# Patient Record
Sex: Female | Born: 1950 | Race: White | Hispanic: No | Smoking: Former smoker
Health system: Southern US, Community
[De-identification: ages and names within clinical notes are randomized; demographics above are authoritative.]

## PROBLEM LIST (undated history)

## (undated) DIAGNOSIS — Z973 Presence of spectacles and contact lenses: Secondary | ICD-10-CM

## (undated) DIAGNOSIS — J189 Pneumonia, unspecified organism: Secondary | ICD-10-CM

## (undated) DIAGNOSIS — I10 Essential (primary) hypertension: Secondary | ICD-10-CM

## (undated) DIAGNOSIS — C50919 Malignant neoplasm of unspecified site of unspecified female breast: Secondary | ICD-10-CM

## (undated) DIAGNOSIS — R112 Nausea with vomiting, unspecified: Secondary | ICD-10-CM

## (undated) DIAGNOSIS — E785 Hyperlipidemia, unspecified: Secondary | ICD-10-CM

## (undated) DIAGNOSIS — Z9889 Other specified postprocedural states: Secondary | ICD-10-CM

## (undated) DIAGNOSIS — M1990 Unspecified osteoarthritis, unspecified site: Secondary | ICD-10-CM

## (undated) DIAGNOSIS — T753XXA Motion sickness, initial encounter: Secondary | ICD-10-CM

## (undated) DIAGNOSIS — I739 Peripheral vascular disease, unspecified: Secondary | ICD-10-CM

## (undated) DIAGNOSIS — E240 Pituitary-dependent Cushing's disease: Secondary | ICD-10-CM

## (undated) DIAGNOSIS — R519 Headache, unspecified: Secondary | ICD-10-CM

## (undated) DIAGNOSIS — T7840XA Allergy, unspecified, initial encounter: Secondary | ICD-10-CM

## (undated) DIAGNOSIS — F419 Anxiety disorder, unspecified: Secondary | ICD-10-CM

## (undated) DIAGNOSIS — F32A Depression, unspecified: Secondary | ICD-10-CM

## (undated) DIAGNOSIS — G4730 Sleep apnea, unspecified: Secondary | ICD-10-CM

## (undated) DIAGNOSIS — Z87440 Personal history of urinary (tract) infections: Secondary | ICD-10-CM

## (undated) DIAGNOSIS — F329 Major depressive disorder, single episode, unspecified: Secondary | ICD-10-CM

## (undated) DIAGNOSIS — R51 Headache: Secondary | ICD-10-CM

## (undated) HISTORY — DX: Malignant neoplasm of unspecified site of unspecified female breast: C50.919

## (undated) HISTORY — PX: TONSILLECTOMY: SUR1361

## (undated) HISTORY — PX: OTHER SURGICAL HISTORY: SHX169

## (undated) HISTORY — DX: Allergy, unspecified, initial encounter: T78.40XA

## (undated) HISTORY — PX: BREAST BIOPSY: SHX20

## (undated) HISTORY — DX: Essential (primary) hypertension: I10

## (undated) HISTORY — PX: COLONOSCOPY: SHX5424

## (undated) HISTORY — DX: Depression, unspecified: F32.A

## (undated) HISTORY — DX: Anxiety disorder, unspecified: F41.9

## (undated) HISTORY — DX: Hyperlipidemia, unspecified: E78.5

## (undated) HISTORY — DX: Unspecified osteoarthritis, unspecified site: M19.90

## (undated) HISTORY — PX: JOINT REPLACEMENT: SHX530

## (undated) HISTORY — PX: ADRENALECTOMY: SHX876

## (undated) HISTORY — PX: HERNIA REPAIR: SHX51

## (undated) HISTORY — PX: BREAST LUMPECTOMY: SHX2

## (undated) HISTORY — PX: DILATION AND CURETTAGE OF UTERUS: SHX78

## (undated) HISTORY — PX: TONSILLECTOMY AND ADENOIDECTOMY: SHX28

## (undated) HISTORY — PX: ENDOMETRIAL ABLATION: SHX621

## (undated) HISTORY — DX: Major depressive disorder, single episode, unspecified: F32.9

## (undated) HISTORY — DX: Pituitary-dependent Cushing's disease: E24.0

## (undated) HISTORY — PX: AXILLARY SENTINEL NODE BIOPSY: SHX5738

---

## 2011-03-17 ENCOUNTER — Other Ambulatory Visit: Payer: Self-pay | Admitting: Obstetrics & Gynecology

## 2011-03-17 ENCOUNTER — Ambulatory Visit (INDEPENDENT_AMBULATORY_CARE_PROVIDER_SITE_OTHER): Payer: Self-pay | Admitting: Obstetrics & Gynecology

## 2011-03-17 ENCOUNTER — Other Ambulatory Visit (HOSPITAL_COMMUNITY)
Admission: RE | Admit: 2011-03-17 | Discharge: 2011-03-17 | Disposition: A | Discharge status: Home / Self Care | Payer: Self-pay | Source: Ambulatory Visit | Attending: Obstetrics & Gynecology | Admitting: Obstetrics & Gynecology

## 2011-03-17 ENCOUNTER — Encounter: Payer: Self-pay | Admitting: Obstetrics & Gynecology

## 2011-03-17 VITALS — BP 127/77 | HR 62 | Temp 97.7°F | Ht 63.0 in | Wt 238.8 lb

## 2011-03-17 DIAGNOSIS — N840 Polyp of corpus uteri: Secondary | ICD-10-CM | POA: Insufficient documentation

## 2011-03-17 DIAGNOSIS — N841 Polyp of cervix uteri: Secondary | ICD-10-CM

## 2011-03-17 DIAGNOSIS — N950 Postmenopausal bleeding: Secondary | ICD-10-CM

## 2011-03-17 LAB — CORTISOL: Cortisol, Plasma: 12.5 ug/dL

## 2011-03-17 LAB — CBC
HCT: 43.6 % (ref 36.0–46.0)
Hemoglobin: 14.3 g/dL (ref 12.0–15.0)
MCHC: 32.8 g/dL (ref 30.0–36.0)
RBC: 4.24 MIL/uL (ref 3.87–5.11)

## 2011-03-17 LAB — PROLACTIN: Prolactin: 15.6 ng/mL

## 2011-03-17 NOTE — Progress Notes (Addendum)
Subjective: Patient is a 60 yo postmenopausal woman sent here for evaluation of postmenopausal bleeding (PMB).  She reports being menopausal since age 5, was on HRT briefly but discontinued it due to irregular bleeding.  No bleeding since until April 2012.  She reports having spotting-small amount of bleeding that lasts for 1-3 days, about 2-3 times a month.  Associated with cramping.  Her PCP ordered a PUS (01/10/11) that showed 10 week sized uterus with heterogenous echotexture, 2.7cm fundal posterior fibroid on left side, 8 mm endometrial thickness, normal ovaries but does have 1.5cm simple cyst on the right ovary.  She also had a normal pap smear on 01/09/11.  The following portions of the patient's history were reviewed and updated as appropriate: allergies, current medications, past family history, past medical history, past social history, past surgical history and problem list.  Patient counseled regarding the need for endometrial biopsy and lab evaluation.  Objective: Filed Vitals:   03/17/11 1109  BP: 127/77  Pulse: 62  Temp: 97.7 F (36.5 C)  TempSrc: Oral  Height: 5\' 3"  (1.6 m)  Weight: 238 lb 12.8 oz (108.319 kg)  Gen: NAD Pelvic:  NEFG. Vaq[place] with mild atrophy.  Small (7mm) polypoid lesion noted in the external os, removed with ring forceps and sent to pathology.  Patient counseled regarding need for endometrial biopsy.  ENDOMETRIAL BIOPSY     The indications for endometrial biopsy were reviewed.   Risks of the biopsy including cramping, bleeding, infection, uterine perforation, inadequate specimen and need for additional procedures  were discussed. The patient states she understands and agrees to undergo procedure today. Consent was signed. Time out was performed. Urine HCG was negative. A sterile speculum was placed in the patient's vagina and the cervix was prepped with Betadine. A single-toothed tenaculum was placed on the anterior lip of the cervix to stabilize it. The 3 mm  pipelle was introduced into the endometrial cavity without difficulty to a depth of 9 cm, 2 passes were made.  A  moderate amount of tissue was  sent to pathology. The instruments were removed from the patient's vagina. Minimal bleeding from the cervix was noted. The patient tolerated the procedure well.  Routine post-procedure instructions were given to the patient. The patient will follow up to review the results and for further management.    Plan:  Follow up cervical polyp pathology and endometrial biopsy results.  Tracie Frank A 03/17/2011 1:10 PM

## 2011-04-20 ENCOUNTER — Ambulatory Visit (INDEPENDENT_AMBULATORY_CARE_PROVIDER_SITE_OTHER): Payer: Self-pay | Admitting: Obstetrics & Gynecology

## 2011-04-20 VITALS — BP 125/76 | HR 62 | Temp 97.0°F | Ht 63.0 in | Wt 239.5 lb

## 2011-04-20 DIAGNOSIS — K439 Ventral hernia without obstruction or gangrene: Secondary | ICD-10-CM

## 2011-04-20 NOTE — Progress Notes (Signed)
Pt referred to General Surgery in [place]. Appt made for 9/19 at 4 pm. Pt aware and agrees. Pt also signed ROI for results of endometrial biopsy and labs to be released to self. Records given.

## 2011-04-20 NOTE — Progress Notes (Signed)
  Subjective:    Patient ID: Tracie Frank, female    DOB: 1951/03/20, 60 y.o.   MRN: [id number]  HPIPatient had cervical polyp removed and EM biopsy 8/10, with benign result. Still some low abdominal pain, slight brown discharge. She notes "baseball" sized bulge left abdomen, getting larger    Review of SystemsAs above     Objective:   Physical Exam Large hernia at incision site on left abdomen, not tender  TSH .226, low     Assessment & Plan:  Benign pathology  Abnl TSH Hernia Refer to Genl surgery RTC 2 mo

## 2011-04-26 ENCOUNTER — Telehealth: Payer: Self-pay | Admitting: *Deleted

## 2011-04-26 NOTE — Telephone Encounter (Signed)
Pt called to request that we mail her the office note from her visit with Dr. Debroah Loop. She signed a release at the time of her visit, and received her pathology results and labs, however the visit note was not completed at that time. Erie Noe at front desk will mail pt a copy of this dictation per the pt's request and signed release.

## 2012-02-28 ENCOUNTER — Ambulatory Visit: Payer: Self-pay | Admitting: Physician Assistant

## 2014-04-01 HISTORY — PX: COLONOSCOPY: SHX5424

## 2016-01-04 ENCOUNTER — Encounter (HOSPITAL_COMMUNITY): Payer: Self-pay | Admitting: *Deleted

## 2016-01-04 ENCOUNTER — Encounter (HOSPITAL_COMMUNITY): Payer: Self-pay

## 2016-01-04 ENCOUNTER — Encounter (HOSPITAL_COMMUNITY)
Admission: RE | Admit: 2016-01-04 | Discharge: 2016-01-04 | Disposition: A | Discharge status: Home / Self Care | Payer: Medicare Other | Source: Ambulatory Visit | Attending: Orthopedic Surgery | Admitting: Orthopedic Surgery

## 2016-01-04 DIAGNOSIS — M25551 Pain in right hip: Secondary | ICD-10-CM | POA: Diagnosis not present

## 2016-01-04 DIAGNOSIS — M1611 Unilateral primary osteoarthritis, right hip: Secondary | ICD-10-CM | POA: Diagnosis not present

## 2016-01-04 DIAGNOSIS — Z01812 Encounter for preprocedural laboratory examination: Secondary | ICD-10-CM | POA: Diagnosis not present

## 2016-01-04 DIAGNOSIS — R9431 Abnormal electrocardiogram [ECG] [EKG]: Secondary | ICD-10-CM | POA: Diagnosis not present

## 2016-01-04 DIAGNOSIS — Z01810 Encounter for preprocedural cardiovascular examination: Secondary | ICD-10-CM | POA: Insufficient documentation

## 2016-01-04 HISTORY — DX: Personal history of urinary (tract) infections: Z87.440

## 2016-01-04 LAB — ABO/RH: ABO/RH(D): A POS

## 2016-01-04 LAB — BASIC METABOLIC PANEL
ANION GAP: 7 (ref 5–15)
BUN: 36 mg/dL — AB (ref 6–20)
CHLORIDE: 107 mmol/L (ref 101–111)
CO2: 27 mmol/L (ref 22–32)
Calcium: 10.1 mg/dL (ref 8.9–10.3)
Creatinine, Ser: 0.91 mg/dL (ref 0.44–1.00)
GFR calc Af Amer: >60 mL/min (ref >60)
GFR calc non Af Amer: >60 mL/min (ref >60)
GLUCOSE: 114 mg/dL — AB (ref 65–99)
POTASSIUM: 3.5 mmol/L (ref 3.5–5.1)
Sodium: 141 mmol/L (ref 135–145)

## 2016-01-04 LAB — SURGICAL PCR SCREEN
MRSA, PCR: NEGATIVE
Staphylococcus aureus: NEGATIVE

## 2016-01-04 LAB — CBC
HEMATOCRIT: 42.3 % (ref 36.0–46.0)
HEMOGLOBIN: 13.9 g/dL (ref 12.0–15.0)
MCH: 32.9 pg (ref 26.0–34.0)
MCHC: 32.9 g/dL (ref 30.0–36.0)
MCV: 100.2 fL — AB (ref 78.0–100.0)
Platelets: 396 10*3/uL (ref 150–400)
RBC: 4.22 MIL/uL (ref 3.87–5.11)
RDW: 13.3 % (ref 11.5–15.5)
WBC: 9.8 10*3/uL (ref 4.0–10.5)

## 2016-01-04 NOTE — Progress Notes (Signed)
Your patient has screened at an elevated risk for Obstructive Sleep Apnea using the Stop-Bang Tool during a pre-surgical visit. Pt scored at high risk.

## 2016-01-04 NOTE — Patient Instructions (Signed)
Tracie Frank  01/04/2016   Your procedure is scheduled on: Tuesday January 11, 2016  Report to [facility] Main  Entrance take [place]  elevators to 3rd floor to  [place] at 7:00 AM.  Call this number if you have problems the morning of surgery [phone]   Remember: ONLY 1 PERSON MAY GO WITH YOU TO SHORT STAY TO GET  READY MORNING OF [place].  Do not eat food or drink liquids :After Midnight.     Take these medicines the morning of surgery with A SIP OF WATER: Fluoxetine (Prozac) DO NOT TAKE ANY DIABETIC MEDICATIONS DAY OF YOUR SURGERY                               You may not have any metal on your body including hair pins and              piercings  Do not wear jewelry, make-up, lotions, powders or perfumes, deodorant             Do not wear nail polish.  Do not shave  48 hours prior to surgery.                 Do not bring valuables to the hospital. [place].  Contacts, dentures or bridgework may not be worn into surgery.  Leave suitcase in the car. After surgery it may be brought to your room.                Please read over the following fact sheets you were given:MRSA INFORMATION SHEET; INCENTIVE SPIROMETER; BLOOD TRANSFUSION INFORMATION SHEET  _____________________________________________________________________             [facility] - Preparing for Surgery Before surgery, you can play an important role.  Because skin is not sterile, your skin needs to be as free of germs as possible.  You can reduce the number of germs on your skin by washing with CHG (chlorahexidine gluconate) soap before surgery.  CHG is an antiseptic cleaner which kills germs and bonds with the skin to continue killing germs even after washing. Please DO NOT use if you have an allergy to CHG or antibacterial soaps.  If your skin becomes reddened/irritated stop using the CHG and inform your nurse when you arrive at  Short Stay. Do not shave (including legs and underarms) for at least 48 hours prior to the first CHG shower.  You may shave your face/neck. Please follow these instructions carefully:  1.  Shower with CHG Soap the night before surgery and the  morning of Surgery.  2.  If you choose to wash your hair, wash your hair first as usual with your  normal  shampoo.  3.  After you shampoo, rinse your hair and body thoroughly to remove the  shampoo.                           4.  Use CHG as you would any other liquid soap.  You can apply chg directly  to the skin and wash  Gently with a scrungie or clean washcloth.  5.  Apply the CHG Soap to your body ONLY FROM THE NECK DOWN.   Do not use on face/ open                           Wound or open sores. Avoid contact with eyes, ears mouth and genitals (private parts).                       Wash face,  Genitals (private parts) with your normal soap.             6.  Wash thoroughly, paying special attention to the area where your surgery  will be performed.  7.  Thoroughly rinse your body with warm water from the neck down.  8.  DO NOT shower/wash with your normal soap after using and rinsing off  the CHG Soap.                9.  Pat yourself dry with a clean towel.            10.  Wear clean pajamas.            11.  Place clean sheets on your bed the night of your first shower and do not  sleep with pets. Day of Surgery : Do not apply any lotions/deodorants the morning of surgery.  Please wear clean clothes to the hospital/surgery center.  FAILURE TO FOLLOW THESE INSTRUCTIONS MAY RESULT IN THE CANCELLATION OF YOUR SURGERY PATIENT SIGNATURE_________________________________  NURSE SIGNATURE__________________________________  ________________________________________________________________________   Adam Phenix  An incentive spirometer is a tool that can help keep your lungs clear and active. This tool measures how well you are  filling your lungs with each breath. Taking long deep breaths may help reverse or decrease the chance of developing breathing (pulmonary) problems (especially infection) following:  A long period of time when you are unable to move or be active. BEFORE THE PROCEDURE   If the spirometer includes an indicator to show your best effort, your nurse or respiratory therapist will set it to a desired goal.  If possible, sit up straight or lean slightly forward. Try not to slouch.  Hold the incentive spirometer in an upright position. INSTRUCTIONS FOR USE   Sit on the edge of your bed if possible, or sit up as far as you can in bed or on a chair.  Hold the incentive spirometer in an upright position.  Breathe out normally.  Place the mouthpiece in your mouth and seal your lips tightly around it.  Breathe in slowly and as deeply as possible, raising the piston or the ball toward the top of the column.  Hold your breath for 3-5 seconds or for as long as possible. Allow the piston or ball to fall to the bottom of the column.  Remove the mouthpiece from your mouth and breathe out normally.  Rest for a few seconds and repeat Steps 1 through 7 at least 10 times every 1-2 hours when you are awake. Take your time and take a few normal breaths between deep breaths.  The spirometer may include an indicator to show your best effort. Use the indicator as a goal to work toward during each repetition.  After each set of 10 deep breaths, practice coughing to be sure your lungs are clear. If you have an incision (the cut made at the time of surgery),  support your incision when coughing by placing a pillow or rolled up towels firmly against it. Once you are able to get out of bed, walk around indoors and cough well. You may stop using the incentive spirometer when instructed by your caregiver.  RISKS AND COMPLICATIONS  Take your time so you do not get dizzy or light-headed.  If you are in pain, you may  need to take or ask for pain medication before doing incentive spirometry. It is harder to take a deep breath if you are having pain. AFTER USE  Rest and breathe slowly and easily.  It can be helpful to keep track of a log of your progress. Your caregiver can provide you with a simple table to help with this. If you are using the spirometer at home, follow these instructions: [place] IF:   You are having difficultly using the spirometer.  You have trouble using the spirometer as often as instructed.  Your pain medication is not giving enough relief while using the spirometer.  You develop fever of 100.5 F (38.1 C) or higher. SEEK IMMEDIATE MEDICAL CARE IF:   You cough up bloody sputum that had not been present before.  You develop fever of 102 F (38.9 C) or greater.  You develop worsening pain at or near the incision site. MAKE SURE YOU:   Understand these instructions.  Will watch your condition.  Will get help right away if you are not doing well or get worse. Document Released: 12/04/2006 Document Revised: 10/16/2011 Document Reviewed: 02/04/2007 ExitCare Patient Information 2014 ExitCare, [place].   ________________________________________________________________________  WHAT IS A BLOOD TRANSFUSION? Blood Transfusion Information  A transfusion is the replacement of blood or some of its parts. Blood is made up of multiple cells which provide different functions.  Red blood cells carry oxygen and are used for blood loss replacement.  White blood cells fight against infection.  Platelets control bleeding.  Plasma helps clot blood.  Other blood products are available for specialized needs, such as hemophilia or other clotting disorders. BEFORE THE TRANSFUSION  Who gives blood for transfusions?   Healthy volunteers who are fully evaluated to make sure their blood is safe. This is blood bank blood. Transfusion therapy is the safest it has ever been in  the practice of medicine. Before blood is taken from a donor, a complete history is taken to make sure that person has no history of diseases nor engages in risky social behavior (examples are intravenous drug use or sexual activity with multiple partners). The donor's travel history is screened to minimize risk of transmitting infections, such as malaria. The donated blood is tested for signs of infectious diseases, such as HIV and hepatitis. The blood is then tested to be sure it is compatible with you in order to minimize the chance of a transfusion reaction. If you or a relative donates blood, this is often done in anticipation of surgery and is not appropriate for emergency situations. It takes many days to process the donated blood. RISKS AND COMPLICATIONS Although transfusion therapy is very safe and saves many lives, the main dangers of transfusion include:   Getting an infectious disease.  Developing a transfusion reaction. This is an allergic reaction to something in the blood you were given. Every precaution is taken to prevent this. The decision to have a blood transfusion has been considered carefully by your caregiver before blood is given. Blood is not given unless the benefits outweigh the risks. AFTER THE TRANSFUSION  Right after receiving a blood transfusion, you will usually feel much better and more energetic. This is especially true if your red blood cells have gotten low (anemic). The transfusion raises the level of the red blood cells which carry oxygen, and this usually causes an energy increase.  The nurse administering the transfusion will monitor you carefully for complications. HOME CARE INSTRUCTIONS  No special instructions are needed after a transfusion. You may find your energy is better. Speak with your caregiver about any limitations on activity for underlying diseases you may have. SEEK MEDICAL CARE IF:   Your condition is not improving after your  transfusion.  You develop redness or irritation at the intravenous (IV) site. SEEK IMMEDIATE MEDICAL CARE IF:  Any of the following symptoms occur over the next 12 hours:  Shaking chills.  You have a temperature by mouth above 102 F (38.9 C), not controlled by medicine.  Chest, back, or muscle pain.  People around you feel you are not acting correctly or are confused.  Shortness of breath or difficulty breathing.  Dizziness and fainting.  You get a rash or develop hives.  You have a decrease in urine output.  Your urine turns a dark color or changes to pink, red, or brown. Any of the following symptoms occur over the next 10 days:  You have a temperature by mouth above 102 F (38.9 C), not controlled by medicine.  Shortness of breath.  Weakness after normal activity.  The white part of the eye turns yellow (jaundice).  You have a decrease in the amount of urine or are urinating less often.  Your urine turns a dark color or changes to pink, red, or brown. Document Released: 07/21/2000 Document Revised: 10/16/2011 Document Reviewed: 03/09/2008 [facility] Patient Information 2014 [place], [place].  _______________________________________________________________________

## 2016-01-04 NOTE — Progress Notes (Addendum)
Clearance per chart per [facility] 12/24/2015 A1C results per [facility] / chart 12/24/2015

## 2016-01-04 NOTE — Progress Notes (Signed)
BMP results in epic per PAT visit 01/04/2016 sent to Dr Alvan Dame

## 2016-01-05 NOTE — Progress Notes (Signed)
Pt aware of surgical time change. Verbalized understanding to arrive at [facility] short stay at 5 am on 01/11/2016.

## 2016-01-06 NOTE — H&P (Signed)
TOTAL HIP ADMISSION H&P  Patient is admitted for right total hip arthroplasty, anterior approach.  Subjective:  Chief Complaint: Right hip primary PA /pain  HPI: Tracie Frank, 65 y.o. female, has a history of pain and functional disability in the right hip(s) due to arthritis and patient has failed non-surgical conservative treatments for greater than 12 weeks to include NSAID's and/or analgesics, use of assistive devices and activity modification.  Onset of symptoms was gradual starting 2+ years ago with gradually worsening course since that time.The patient noted no past surgery on the right hip(s).  Patient currently rates pain in the right hip at 10 out of 10 with activity. Patient has worsening of pain with activity and weight bearing, trendelenberg gait, pain that interfers with activities of daily living and pain with passive range of motion. Patient has evidence of periarticular osteophytes and joint space narrowing by imaging studies. This condition presents safety issues increasing the risk of falls.  There is no current active infection.  Risks, benefits and expectations were discussed with the patient.  Risks including but not limited to the risk of anesthesia, blood clots, nerve damage, blood vessel damage, failure of the prosthesis, infection and up to and including death.  Patient understand the risks, benefits and expectations and wishes to proceed with surgery.   PCP: Geanie Cooley., MD  D/C Plans:      Home with HHPT  Post-op Meds:       No Rx given  Tranexamic Acid:      To be given - IV   Decadron:      Is to be given  FYI:     ASA  Norco  No dilaudid or morphine  (may need Toradol)   Past Medical History  Diagnosis Date  . Allergy   . Cushing's disease ([place])   . Hyperlipidemia   . Anxiety   . Arthritis   . Depression   . PONV (postoperative nausea and vomiting)   . Hypertension     pt states was on BP meds in past but is currently not having to take  medications   . Wears glasses   . Pneumonia   . Diabetes mellitus 10 years ago    pt states is currently not taking any medications for DM  . History of frequent urinary tract infections     Past Surgical History  Procedure Laterality Date  . 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 Endometrial ablation    . Bladder polyp    . Tonsillectomy and adenoidectomy    . Tonsillectomy    . Joint replacement      left knee 2013    No prescriptions prior to admission   Allergies  Allergen Reactions  . Gabapentin Other (See Comments)    Other reaction(s): Other Twitching, loss of bladder control  . Lyrica [Pregabalin] Other (See Comments)    Jerking movements; confusion; incontinence  . Etodolac   . Morphine And Related Nausea And Vomiting  . Other Nausea And Vomiting    anesthesia  . Tramadol     Pt denies any issues; states takes daily w/o issues    Social History  Substance Use Topics  . Smoking status: Former Smoker -- 1.00 packs/day for 15 years    Types: Cigarettes    Quit date: 03/16/2000  . Smokeless tobacco: Never Used  . Alcohol Use: No    Family History  Problem Relation Age of Onset  . Cancer Maternal Aunt  leukemia  . Heart disease Maternal Grandmother   . Stroke Maternal Grandmother      Review of Systems  Constitutional: Positive for malaise/fatigue.  HENT: Negative.   Eyes: Negative.   Respiratory: Positive for shortness of breath (on exertion).   Cardiovascular: Negative.   Gastrointestinal: Negative.   Genitourinary: Positive for frequency.  Musculoskeletal: Positive for back pain and joint pain.  Skin: Negative.   Neurological: Negative.   Endo/Heme/Allergies: Positive for environmental allergies.  Psychiatric/Behavioral: Positive for depression and memory loss. The patient is nervous/anxious and has insomnia.     Objective:  Physical Exam  Constitutional: She is oriented to person, place, and time. She appears well-developed.  HENT:  Head:  Normocephalic.  Eyes: Pupils are equal, round, and reactive to light.  Neck: Neck supple. No JVD present. No tracheal deviation present. No thyromegaly present.  Cardiovascular: Normal rate, regular rhythm, normal heart sounds and intact distal pulses.   Respiratory: Effort normal and breath sounds normal. No stridor. No respiratory distress. She has no wheezes.  GI: Soft. There is no tenderness. There is no guarding.  Musculoskeletal:       Right hip: She exhibits decreased range of motion, decreased strength, tenderness and bony tenderness. She exhibits no swelling, no deformity and no laceration.  Lymphadenopathy:    She has no cervical adenopathy.  Neurological: She is alert and oriented to person, place, and time.  Skin: Skin is warm and dry.  Psychiatric: She has a normal mood and affect.      Labs:  Estimated body mass index is 42.44 kg/(m^2) as calculated from the following:   Height as of 04/20/11: 5\' 3"  (1.6 m).   Weight as of 04/20/11: 108.636 kg (239 lb 8 oz).   Imaging Review Plain radiographs demonstrate severe degenerative joint disease of the right hip(s). The bone quality appears to be good for age and reported activity level.  Assessment/Plan:  End stage arthritis, right hip(s)  The patient history, physical examination, clinical judgement of the provider and imaging studies are consistent with end stage degenerative joint disease of the right hip(s) and total hip arthroplasty is deemed medically necessary. The treatment options including medical management, injection therapy, arthroscopy and arthroplasty were discussed at length. The risks and benefits of total hip arthroplasty were presented and reviewed. The risks due to aseptic loosening, infection, stiffness, dislocation/subluxation,  thromboembolic complications and other imponderables were discussed.  The patient acknowledged the explanation, agreed to proceed with the plan and consent was signed. Patient is  being admitted for inpatient treatment for surgery, pain control, PT, OT, prophylactic antibiotics, VTE prophylaxis, progressive ambulation and ADL's and discharge planning.The patient is planning to be discharged home with home health services.      [place] Pugh Felicity Penix   PA-C  01/06/2016, 8:06 AM

## 2016-01-10 NOTE — Anesthesia Preprocedure Evaluation (Signed)
Anesthesia Evaluation  Patient identified by MRN, date of birth, ID band Patient awake    Reviewed: Allergy & Precautions, H&P , Patient's Chart, lab work & pertinent test results  History of Anesthesia Complications (+) history of anesthetic complications  Airway Mallampati: II  TM Distance: >3 FB Neck ROM: full    Dental no notable dental hx.    Pulmonary former smoker,    Pulmonary exam normal breath sounds clear to auscultation       Cardiovascular Exercise Tolerance: Good hypertension,  Rhythm:regular Rate:Normal     Neuro/Psych    GI/Hepatic   Endo/Other  diabetesMorbid obesity  Renal/GU      Musculoskeletal   Abdominal   Peds  Hematology   Anesthesia Other Findings Cushing's disease ([place])    Hyperlipidemia     Anxiety    Arthritis     Depression    Hypertension....... is currently not having to take medications       Pneumonia     Diabetes mellitus 10 years ago... is currently not taking any medications for DM       Reproductive/Obstetrics                             Anesthesia Physical Anesthesia Plan  ASA: II  Anesthesia Plan: Spinal   Post-op Pain Management:    Induction:   Airway Management Planned:   Additional Equipment:   Intra-op Plan:   Post-operative Plan:   Informed Consent: I have reviewed the patients History and Physical, chart, labs and discussed the procedure including the risks, benefits and alternatives for the proposed anesthesia with the patient or authorized representative who has indicated his/her understanding and acceptance.   Dental Advisory Given  Plan Discussed with: CRNA  Anesthesia Plan Comments: (Lab work confirmed with CRNA in room. Platelets okay. Discussed spinal anesthetic, and patient consents to the procedure:  included risk of possible headache,backache, failed block, allergic reaction, and nerve injury. This  patient was asked if she had any questions or concerns before the procedure started. )        Anesthesia Quick Evaluation

## 2016-01-11 ENCOUNTER — Inpatient Hospital Stay (HOSPITAL_COMMUNITY): Payer: Medicare Other

## 2016-01-11 ENCOUNTER — Inpatient Hospital Stay (HOSPITAL_COMMUNITY): Payer: Medicare Other | Admitting: Anesthesiology

## 2016-01-11 ENCOUNTER — Inpatient Hospital Stay (HOSPITAL_COMMUNITY)
Admission: RE | Admit: 2016-01-11 | Discharge: 2016-01-12 | DRG: 470 | Disposition: A | Discharge status: Home Health | Payer: Medicare Other | Source: Ambulatory Visit | Attending: Orthopedic Surgery | Admitting: Orthopedic Surgery

## 2016-01-11 ENCOUNTER — Encounter (HOSPITAL_COMMUNITY): Payer: Self-pay

## 2016-01-11 ENCOUNTER — Encounter (HOSPITAL_COMMUNITY)
Admission: RE | Disposition: A | Discharge status: Home Health | Payer: Self-pay | Source: Ambulatory Visit | Attending: Orthopedic Surgery

## 2016-01-11 DIAGNOSIS — Z823 Family history of stroke: Secondary | ICD-10-CM

## 2016-01-11 DIAGNOSIS — M1611 Unilateral primary osteoarthritis, right hip: Secondary | ICD-10-CM | POA: Diagnosis present

## 2016-01-11 DIAGNOSIS — E785 Hyperlipidemia, unspecified: Secondary | ICD-10-CM | POA: Diagnosis present

## 2016-01-11 DIAGNOSIS — Z8249 Family history of ischemic heart disease and other diseases of the circulatory system: Secondary | ICD-10-CM

## 2016-01-11 DIAGNOSIS — M25551 Pain in right hip: Secondary | ICD-10-CM | POA: Diagnosis present

## 2016-01-11 DIAGNOSIS — Z888 Allergy status to other drugs, medicaments and biological substances status: Secondary | ICD-10-CM

## 2016-01-11 DIAGNOSIS — E249 Cushing's syndrome, unspecified: Secondary | ICD-10-CM | POA: Diagnosis present

## 2016-01-11 DIAGNOSIS — Z885 Allergy status to narcotic agent status: Secondary | ICD-10-CM | POA: Diagnosis not present

## 2016-01-11 DIAGNOSIS — Z806 Family history of leukemia: Secondary | ICD-10-CM

## 2016-01-11 DIAGNOSIS — Z96649 Presence of unspecified artificial hip joint: Secondary | ICD-10-CM

## 2016-01-11 DIAGNOSIS — Z87891 Personal history of nicotine dependence: Secondary | ICD-10-CM | POA: Diagnosis not present

## 2016-01-11 DIAGNOSIS — Z6841 Body Mass Index (BMI) 40.0 and over, adult: Secondary | ICD-10-CM | POA: Diagnosis not present

## 2016-01-11 DIAGNOSIS — Z886 Allergy status to analgesic agent status: Secondary | ICD-10-CM | POA: Diagnosis not present

## 2016-01-11 HISTORY — DX: Motion sickness, initial encounter: T75.3XXA

## 2016-01-11 HISTORY — DX: Pneumonia, unspecified organism: J18.9

## 2016-01-11 HISTORY — DX: Presence of spectacles and contact lenses: Z97.3

## 2016-01-11 HISTORY — DX: Other specified postprocedural states: R11.2

## 2016-01-11 HISTORY — DX: Other specified postprocedural states: Z98.890

## 2016-01-11 HISTORY — PX: TOTAL HIP ARTHROPLASTY: SHX124

## 2016-01-11 LAB — TYPE AND SCREEN
ABO/RH(D): A POS
ANTIBODY SCREEN: NEGATIVE

## 2016-01-11 LAB — GLUCOSE, CAPILLARY
Glucose-Capillary: 125 mg/dL — ABNORMAL HIGH (ref 65–99)
Glucose-Capillary: 151 mg/dL — ABNORMAL HIGH (ref 65–99)
Glucose-Capillary: 207 mg/dL — ABNORMAL HIGH (ref 65–99)
Glucose-Capillary: 216 mg/dL — ABNORMAL HIGH (ref 65–99)
Glucose-Capillary: 162 mg/dL — ABNORMAL HIGH (ref 65–99)

## 2016-01-11 SURGERY — ARTHROPLASTY, HIP, TOTAL, ANTERIOR APPROACH
Anesthesia: Spinal | Site: Hip | Laterality: Right

## 2016-01-11 MED ORDER — SODIUM CHLORIDE 0.9 % IJ SOLN
INTRAMUSCULAR | Status: AC
  Filled 2016-01-11: qty 10

## 2016-01-11 MED ORDER — MIDAZOLAM HCL 2 MG/2ML IJ SOLN
INTRAMUSCULAR | Status: AC
  Filled 2016-01-11: qty 2

## 2016-01-11 MED ORDER — BUPIVACAINE IN DEXTROSE 0.75-8.25 % IT SOLN
INTRATHECAL | Status: DC | PRN
  Administered 2016-01-11: 1.5 mL via INTRATHECAL

## 2016-01-11 MED ORDER — ZOLPIDEM TARTRATE 10 MG PO TABS: 10.0000 mg | ORAL_TABLET | Freq: Every evening | ORAL | Status: DC | PRN

## 2016-01-11 MED ORDER — CEFAZOLIN SODIUM-DEXTROSE 2-4 GM/100ML-% IV SOLN
2.0000 g | INTRAVENOUS | Status: AC
  Administered 2016-01-11: 2 g via INTRAVENOUS

## 2016-01-11 MED ORDER — METHOCARBAMOL 500 MG PO TABS
500.0000 mg | ORAL_TABLET | Freq: Four times a day (QID) | ORAL | Status: DC | PRN
  Administered 2016-01-11 – 2016-01-12 (×3): 500 mg via ORAL
  Filled 2016-01-11 (×3): qty 1

## 2016-01-11 MED ORDER — FENTANYL CITRATE (PF) 100 MCG/2ML IJ SOLN
25.0000 ug | INTRAMUSCULAR | Status: DC | PRN
  Administered 2016-01-11 (×2): 50 ug via INTRAVENOUS

## 2016-01-11 MED ORDER — METOCLOPRAMIDE HCL 5 MG PO TABS: 5.0000 mg | ORAL_TABLET | Freq: Three times a day (TID) | ORAL | Status: DC | PRN

## 2016-01-11 MED ORDER — ONDANSETRON HCL 4 MG/2ML IJ SOLN
INTRAMUSCULAR | Status: DC | PRN
  Administered 2016-01-11: 4 mg via INTRAVENOUS

## 2016-01-11 MED ORDER — CEFAZOLIN SODIUM-DEXTROSE 2-4 GM/100ML-% IV SOLN
INTRAVENOUS | Status: AC
  Filled 2016-01-11: qty 100

## 2016-01-11 MED ORDER — PRAVASTATIN SODIUM 20 MG PO TABS
80.0000 mg | ORAL_TABLET | Freq: Every day | ORAL | Status: DC
Start: 2016-01-11 — End: 2016-01-12
  Administered 2016-01-11 – 2016-01-12 (×2): 80 mg via ORAL
  Filled 2016-01-11 (×2): qty 4

## 2016-01-11 MED ORDER — ALPRAZOLAM 0.25 MG PO TABS
0.2500 mg | ORAL_TABLET | Freq: Every evening | ORAL | Status: DC | PRN
  Administered 2016-01-11: 0.25 mg via ORAL
  Filled 2016-01-11: qty 1

## 2016-01-11 MED ORDER — FERROUS SULFATE 325 (65 FE) MG PO TABS
325.0000 mg | ORAL_TABLET | Freq: Three times a day (TID) | ORAL | Status: DC
  Administered 2016-01-11 – 2016-01-12 (×3): 325 mg via ORAL
  Filled 2016-01-11 (×3): qty 1

## 2016-01-11 MED ORDER — HYDROCODONE-ACETAMINOPHEN 7.5-325 MG PO TABS
1.0000 | ORAL_TABLET | ORAL | Status: DC
  Administered 2016-01-11 (×2): 1 via ORAL
  Administered 2016-01-11 – 2016-01-12 (×6): 2 via ORAL
  Filled 2016-01-11: qty 2
  Filled 2016-01-11: qty 1
  Filled 2016-01-11 (×5): qty 2
  Filled 2016-01-11: qty 1

## 2016-01-11 MED ORDER — DIPHENHYDRAMINE HCL 25 MG PO CAPS: 25.0000 mg | ORAL_CAPSULE | Freq: Four times a day (QID) | ORAL | Status: DC | PRN

## 2016-01-11 MED ORDER — MIDAZOLAM HCL 5 MG/5ML IJ SOLN
INTRAMUSCULAR | Status: DC | PRN
  Administered 2016-01-11: 1 mg via INTRAVENOUS

## 2016-01-11 MED ORDER — CHLORHEXIDINE GLUCONATE 4 % EX LIQD
60.0000 mL | Freq: Once | CUTANEOUS | Status: DC
Start: 2016-01-11 — End: 2016-01-11

## 2016-01-11 MED ORDER — BISACODYL 10 MG RE SUPP: 10.0000 mg | Freq: Every day | RECTAL | Status: DC | PRN

## 2016-01-11 MED ORDER — SODIUM CHLORIDE 0.9 % IV SOLN
100.0000 mL/h | INTRAVENOUS | Status: DC
  Administered 2016-01-11 (×2): 100 mL/h via INTRAVENOUS
  Filled 2016-01-11 (×4): qty 1000

## 2016-01-11 MED ORDER — FENTANYL CITRATE (PF) 100 MCG/2ML IJ SOLN
INTRAMUSCULAR | Status: AC
  Filled 2016-01-11: qty 2

## 2016-01-11 MED ORDER — DEXAMETHASONE SODIUM PHOSPHATE 10 MG/ML IJ SOLN
10.0000 mg | Freq: Once | INTRAMUSCULAR | Status: AC
  Administered 2016-01-11: 10 mg via INTRAVENOUS

## 2016-01-11 MED ORDER — ONDANSETRON HCL 4 MG/2ML IJ SOLN
INTRAMUSCULAR | Status: AC
  Filled 2016-01-11: qty 2

## 2016-01-11 MED ORDER — LIDOCAINE HCL (CARDIAC) 20 MG/ML IV SOLN
INTRAVENOUS | Status: DC | PRN
  Administered 2016-01-11: 50 mg via INTRAVENOUS

## 2016-01-11 MED ORDER — DOCUSATE SODIUM 100 MG PO CAPS
100.0000 mg | ORAL_CAPSULE | Freq: Two times a day (BID) | ORAL | Status: DC
  Administered 2016-01-11 – 2016-01-12 (×3): 100 mg via ORAL
  Filled 2016-01-11 (×3): qty 1

## 2016-01-11 MED ORDER — ALUM & MAG HYDROXIDE-SIMETH 200-200-20 MG/5ML PO SUSP
30.0000 mL | ORAL | Status: DC | PRN
Start: 2016-01-11 — End: 2016-01-12

## 2016-01-11 MED ORDER — FENTANYL CITRATE (PF) 100 MCG/2ML IJ SOLN
INTRAMUSCULAR | Status: DC | PRN
  Administered 2016-01-11: 50 ug via INTRAVENOUS

## 2016-01-11 MED ORDER — HYDROMORPHONE HCL 1 MG/ML IJ SOLN
INTRAMUSCULAR | Status: AC
  Filled 2016-01-11: qty 1

## 2016-01-11 MED ORDER — PHENOL 1.4 % MT LIQD: 1.0000 | OROMUCOSAL | Status: DC | PRN

## 2016-01-11 MED ORDER — PROPOFOL 500 MG/50ML IV EMUL
INTRAVENOUS | Status: DC | PRN
  Administered 2016-01-11: 25 ug/kg/min via INTRAVENOUS

## 2016-01-11 MED ORDER — KETOROLAC TROMETHAMINE 15 MG/ML IJ SOLN
15.0000 mg | Freq: Four times a day (QID) | INTRAMUSCULAR | Status: DC
  Administered 2016-01-11 – 2016-01-12 (×5): 15 mg via INTRAVENOUS
  Filled 2016-01-11 (×5): qty 1

## 2016-01-11 MED ORDER — PROPOFOL 10 MG/ML IV BOLUS
INTRAVENOUS | Status: AC
  Filled 2016-01-11: qty 20

## 2016-01-11 MED ORDER — TRANEXAMIC ACID 1000 MG/10ML IV SOLN
1000.0000 mg | Freq: Once | INTRAVENOUS | Status: AC
  Administered 2016-01-11: 1000 mg via INTRAVENOUS
  Filled 2016-01-11: qty 10

## 2016-01-11 MED ORDER — MENTHOL 3 MG MT LOZG: 1.0000 | LOZENGE | OROMUCOSAL | Status: DC | PRN

## 2016-01-11 MED ORDER — PHENYLEPHRINE HCL 10 MG/ML IJ SOLN
10.0000 mg | INTRAMUSCULAR | Status: DC | PRN
  Administered 2016-01-11: 50 ug/min via INTRAVENOUS

## 2016-01-11 MED ORDER — HYDROMORPHONE HCL 1 MG/ML IJ SOLN
0.2500 mg | INTRAMUSCULAR | Status: DC | PRN
  Administered 2016-01-11: 0.5 mg via INTRAVENOUS

## 2016-01-11 MED ORDER — ONDANSETRON HCL 4 MG/2ML IJ SOLN: 4.0000 mg | Freq: Four times a day (QID) | INTRAMUSCULAR | Status: DC | PRN

## 2016-01-11 MED ORDER — PHENYLEPHRINE 40 MCG/ML (10ML) SYRINGE FOR IV PUSH (FOR BLOOD PRESSURE SUPPORT)
PREFILLED_SYRINGE | INTRAVENOUS | Status: AC
  Filled 2016-01-11: qty 20

## 2016-01-11 MED ORDER — PROPOFOL 10 MG/ML IV BOLUS
INTRAVENOUS | Status: AC
  Filled 2016-01-11: qty 40

## 2016-01-11 MED ORDER — DEXAMETHASONE SODIUM PHOSPHATE 10 MG/ML IJ SOLN
10.0000 mg | Freq: Once | INTRAMUSCULAR | Status: AC
  Administered 2016-01-12: 10 mg via INTRAVENOUS
  Filled 2016-01-11: qty 1

## 2016-01-11 MED ORDER — KETOROLAC TROMETHAMINE 0.5 % OP SOLN
1.0000 [drp] | Freq: Three times a day (TID) | OPHTHALMIC | Status: AC | PRN
  Administered 2016-01-11 (×2): 1 [drp] via OPHTHALMIC
  Filled 2016-01-11: qty 3

## 2016-01-11 MED ORDER — DIPHENHYDRAMINE HCL 25 MG PO CAPS
50.0000 mg | ORAL_CAPSULE | Freq: Every day | ORAL | Status: DC
  Administered 2016-01-11 – 2016-01-12 (×2): 50 mg via ORAL
  Filled 2016-01-11 (×2): qty 2

## 2016-01-11 MED ORDER — LIDOCAINE HCL (CARDIAC) 20 MG/ML IV SOLN
INTRAVENOUS | Status: AC
  Filled 2016-01-11: qty 5

## 2016-01-11 MED ORDER — DIPHENHYDRAMINE HCL 25 MG PO TABS
50.0000 mg | ORAL_TABLET | Freq: Every morning | ORAL | Status: DC
Start: 2016-01-11 — End: 2016-01-11
  Filled 2016-01-11: qty 2

## 2016-01-11 MED ORDER — EPHEDRINE SULFATE 50 MG/ML IJ SOLN
INTRAMUSCULAR | Status: AC
  Filled 2016-01-11: qty 1

## 2016-01-11 MED ORDER — ONDANSETRON HCL 4 MG PO TABS: 4.0000 mg | ORAL_TABLET | Freq: Four times a day (QID) | ORAL | Status: DC | PRN

## 2016-01-11 MED ORDER — METHOCARBAMOL 1000 MG/10ML IJ SOLN
500.0000 mg | Freq: Four times a day (QID) | INTRAVENOUS | Status: DC | PRN
  Administered 2016-01-11: 500 mg via INTRAVENOUS
  Filled 2016-01-11: qty 550
  Filled 2016-01-11: qty 5

## 2016-01-11 MED ORDER — DEXAMETHASONE SODIUM PHOSPHATE 10 MG/ML IJ SOLN
INTRAMUSCULAR | Status: AC
  Filled 2016-01-11: qty 1

## 2016-01-11 MED ORDER — POLYETHYLENE GLYCOL 3350 17 G PO PACK
17.0000 g | PACK | Freq: Two times a day (BID) | ORAL | Status: DC
  Administered 2016-01-11 – 2016-01-12 (×3): 17 g via ORAL
  Filled 2016-01-11 (×3): qty 1

## 2016-01-11 MED ORDER — LACTATED RINGERS IV SOLN
INTRAVENOUS | Status: DC
  Administered 2016-01-11 (×2): via INTRAVENOUS

## 2016-01-11 MED ORDER — SODIUM CHLORIDE 0.9 % IR SOLN
Status: DC | PRN
  Administered 2016-01-11: 1000 mL

## 2016-01-11 MED ORDER — MAGNESIUM CITRATE PO SOLN: 1.0000 | Freq: Once | ORAL | Status: DC | PRN

## 2016-01-11 MED ORDER — FLUOXETINE HCL 20 MG PO CAPS
40.0000 mg | ORAL_CAPSULE | Freq: Every day | ORAL | Status: DC
  Administered 2016-01-11 – 2016-01-12 (×2): 40 mg via ORAL
  Filled 2016-01-11 (×2): qty 2

## 2016-01-11 MED ORDER — HYDROMORPHONE HCL 1 MG/ML IJ SOLN: 0.5000 mg | INTRAMUSCULAR | Status: DC | PRN

## 2016-01-11 MED ORDER — EPHEDRINE SULFATE 50 MG/ML IJ SOLN
INTRAMUSCULAR | Status: DC | PRN
  Administered 2016-01-11: 5 mg via INTRAVENOUS
  Administered 2016-01-11: 10 mg via INTRAVENOUS
  Administered 2016-01-11: 5 mg via INTRAVENOUS
  Administered 2016-01-11: 10 mg via INTRAVENOUS

## 2016-01-11 MED ORDER — BSS IO SOLN
15.0000 mL | Freq: Three times a day (TID) | INTRAOCULAR | Status: DC
  Administered 2016-01-11 – 2016-01-12 (×4): 15 mL
  Filled 2016-01-11: qty 15

## 2016-01-11 MED ORDER — PHENYLEPHRINE HCL 10 MG/ML IJ SOLN
INTRAMUSCULAR | Status: DC | PRN
  Administered 2016-01-11 (×9): 80 ug via INTRAVENOUS

## 2016-01-11 MED ORDER — METOCLOPRAMIDE HCL 5 MG/ML IJ SOLN
5.0000 mg | Freq: Three times a day (TID) | INTRAMUSCULAR | Status: DC | PRN
  Administered 2016-01-11: 10 mg via INTRAVENOUS
  Filled 2016-01-11: qty 2

## 2016-01-11 MED ORDER — CEFAZOLIN SODIUM-DEXTROSE 2-4 GM/100ML-% IV SOLN
2.0000 g | Freq: Four times a day (QID) | INTRAVENOUS | Status: AC
  Administered 2016-01-11 (×2): 2 g via INTRAVENOUS
  Filled 2016-01-11 (×2): qty 100

## 2016-01-11 MED ORDER — ASPIRIN EC 325 MG PO TBEC
325.0000 mg | DELAYED_RELEASE_TABLET | Freq: Two times a day (BID) | ORAL | Status: DC
  Administered 2016-01-12: 325 mg via ORAL
  Filled 2016-01-11: qty 1

## 2016-01-11 SURGICAL SUPPLY — 36 items
BAG DECANTER FOR FLEXI CONT (MISCELLANEOUS) IMPLANT
BAG ZIPLOCK 12X15 (MISCELLANEOUS) IMPLANT
CAPT HIP TOTAL 2 ×3 IMPLANT
CLOTH BEACON ORANGE TIMEOUT ST (SAFETY) ×3 IMPLANT
COVER PERINEAL POST (MISCELLANEOUS) ×3 IMPLANT
DRAPE STERI IOBAN 125X83 (DRAPES) ×3 IMPLANT
DRAPE U-SHAPE 47X51 STRL (DRAPES) ×6 IMPLANT
DRESSING AQUACEL AG SP 3.5X10 (GAUZE/BANDAGES/DRESSINGS) ×1 IMPLANT
DRSG AQUACEL AG ADV 3.5X10 (GAUZE/BANDAGES/DRESSINGS) ×3 IMPLANT
DRSG AQUACEL AG SP 3.5X10 (GAUZE/BANDAGES/DRESSINGS) ×3
DURAPREP 26ML APPLICATOR (WOUND CARE) ×3 IMPLANT
ELECT REM PT RETURN 15FT ADLT (MISCELLANEOUS) ×3 IMPLANT
ELECT REM PT RETURN 9FT ADLT (ELECTROSURGICAL) ×3
ELECTRODE REM PT RTRN 9FT ADLT (ELECTROSURGICAL) ×1 IMPLANT
GLOVE BIOGEL M STRL SZ7.5 (GLOVE) IMPLANT
GLOVE BIOGEL PI IND STRL 7.5 (GLOVE) ×1 IMPLANT
GLOVE BIOGEL PI IND STRL 8.5 (GLOVE) ×1 IMPLANT
GLOVE BIOGEL PI INDICATOR 7.5 (GLOVE) ×2
GLOVE BIOGEL PI INDICATOR 8.5 (GLOVE) ×2
GLOVE ECLIPSE 8.0 STRL XLNG CF (GLOVE) ×6 IMPLANT
GLOVE ORTHO TXT STRL SZ7.5 (GLOVE) ×3 IMPLANT
GOWN STRL REUS W/TWL LRG LVL3 (GOWN DISPOSABLE) ×3 IMPLANT
GOWN STRL REUS W/TWL XL LVL3 (GOWN DISPOSABLE) ×3 IMPLANT
HOLDER FOLEY CATH W/STRAP (MISCELLANEOUS) ×3 IMPLANT
LIQUID BAND (GAUZE/BANDAGES/DRESSINGS) ×3 IMPLANT
PACK ANTERIOR HIP CUSTOM (KITS) ×3 IMPLANT
SAW OSC TIP CART 19.5X105X1.3 (SAW) ×3 IMPLANT
SUT MNCRL AB 4-0 PS2 18 (SUTURE) ×3 IMPLANT
SUT VIC AB 1 CT1 36 (SUTURE) ×9 IMPLANT
SUT VIC AB 2-0 CT1 27 (SUTURE) ×4
SUT VIC AB 2-0 CT1 TAPERPNT 27 (SUTURE) ×2 IMPLANT
SUT VLOC 180 0 24IN GS25 (SUTURE) ×3 IMPLANT
TRAY FOLEY W/METER SILVER 14FR (SET/KITS/TRAYS/PACK) ×3 IMPLANT
TRAY FOLEY W/METER SILVER 16FR (SET/KITS/TRAYS/PACK) IMPLANT
WATER STERILE IRR 1500ML POUR (IV SOLUTION) ×3 IMPLANT
YANKAUER SUCT BULB TIP 10FT TU (MISCELLANEOUS) IMPLANT

## 2016-01-11 NOTE — Op Note (Signed)
NAME:  Tracie Frank                ACCOUNT NO.: [id number]      MEDICAL RECORD NO.: [id number]      FACILITY:  [facility]      PHYSICIAN:  Paralee Cancel D  DATE OF BIRTH:  1951/07/27     DATE OF PROCEDURE:  01/11/2016                                 OPERATIVE REPORT         PREOPERATIVE DIAGNOSIS: Right  hip osteoarthritis.      POSTOPERATIVE DIAGNOSIS:  Right hip osteoarthritis.      PROCEDURE:  Right total hip replacement through an anterior approach   utilizing DePuy THR system, component size 90mm pinnacle cup, a size 32+4 neutral   Altrex liner, a size 4 standard Tri Lock stem with a 32+1 delta ceramic   ball.      SURGEON:  Pietro Cassis. Alvan Dame, M.D.      ASSISTANT:  Danae Orleans, PA-C      ANESTHESIA:  Spinal.      SPECIMENS:  None.      COMPLICATIONS:  None.      BLOOD LOSS:  350 cc     DRAINS:  None.      INDICATION OF THE PROCEDURE:  Tracie Frank is a 65 y.o. female who had   presented to office for evaluation of right hip pain.  Radiographs revealed   progressive degenerative changes with bone-on-bone   articulation to the  hip joint.  The patient had painful limited range of   motion significantly affecting their overall quality of life.  The patient was failing to    respond to conservative measures, and at this point was ready   to proceed with more definitive measures.  The patient has noted progressive   degenerative changes in his hip, progressive problems and dysfunction   with regarding the hip prior to surgery.  Consent was obtained for   benefit of pain relief.  Specific risk of infection, DVT, component   failure, dislocation, need for revision surgery, as well discussion of   the anterior versus posterior approach were reviewed.  Consent was   obtained for benefit of anterior pain relief through an anterior   approach.      PROCEDURE IN DETAIL:  The patient was brought to operative theater.   Once adequate anesthesia,  preoperative antibiotics, 2gm of Ancef, 1 gm of Tranexamic Acid, and 10 mg of Decadron administered.   The patient was positioned supine on the OSI Hanna table.  Once adequate   padding of boney process was carried out, we had predraped out the hip, and  used fluoroscopy to confirm orientation of the pelvis and position.      The right hip was then prepped and draped from proximal iliac crest to   mid thigh with shower curtain technique.      Time-out was performed identifying the patient, planned procedure, and   extremity.     An incision was then made 2 cm distal and lateral to the   anterior superior iliac spine extending over the orientation of the   tensor fascia lata muscle and sharp dissection was carried down to the   fascia of the muscle and protractor placed in the soft tissues.      The fascia was then  incised.  The muscle belly was identified and swept   laterally and retractor placed along the superior neck.  Following   cauterization of the circumflex vessels and removing some pericapsular   fat, a second cobra retractor was placed on the inferior neck.  A third   retractor was placed on the anterior acetabulum after elevating the   anterior rectus.  A L-capsulotomy was along the line of the   superior neck to the trochanteric fossa, then extended proximally and   distally.  Tag sutures were placed and the retractors were then placed   intracapsular.  We then identified the trochanteric fossa and   orientation of my neck cut, confirmed this radiographically   and then made a neck osteotomy with the femur on traction.  The femoral   head was removed without difficulty or complication.  Traction was let   off and retractors were placed posterior and anterior around the   acetabulum.      The labrum and foveal tissue were debrided.  I began reaming with a 18mm   reamer and reamed up to 44mm reamer with good bony bed preparation and a 20mm   cup was chosen.  The final 71mm  Pinnacle cup was then impacted under fluoroscopy  to confirm the depth of penetration and orientation with respect to   abduction.  A screw was placed followed by the hole eliminator.  The final   32+4 neutral Altrex liner was impacted with good visualized rim fit.  The cup was positioned anatomically within the acetabular portion of the pelvis.      At this point, the femur was rolled at 80 degrees.  Further capsule was   released off the inferior aspect of the femoral neck.  I then   released the superior capsule proximally.  The hook was placed laterally   along the femur and elevated manually and held in position with the bed   hook.  The leg was then extended and adducted with the leg rolled to 100   degrees of external rotation.  Once the proximal femur was fully   exposed, I used a box osteotome to set orientation.  I then began   broaching with the starting chili pepper broach and passed this by hand and then broached up to 4.  With the 4 broach in place I chose a standard offset neck (after initial trial with a high offset neck) and did several trial reductions.  The offset was appropriate, leg lengths   appeared to be equal best matched with the +1 head ball confirmed radiographically.   Given these findings, I went ahead and dislocated the hip, repositioned all   retractors and positioned the right hip in the extended and abducted position.  The final 4 standard Tri Lock stem was   chosen and it was impacted down to the level of neck cut.  Based on this   and the trial reduction, a 32+1 delta ceramic ball was chosen and   impacted onto a clean and dry trunnion, and the hip was reduced.  The   hip had been irrigated throughout the case again at this point.  I did   reapproximate the superior capsular leaflet to the anterior leaflet   using #1 Vicryl.  The fascia of the   tensor fascia lata muscle was then reapproximated using #1 Vicryl and #0 V-lock sutures.  The   remaining wound  was closed with 2-0 Vicryl and running 4-0 Monocryl.  The hip was cleaned, dried, and dressed sterilely using Dermabond and   Aquacel dressing.  She was then brought   to recovery room in stable condition tolerating the procedure well.    Danae Orleans, PA-C was present for the entirety of the case involved from   preoperative positioning, perioperative retractor management, general   facilitation of the case, as well as primary wound closure as assistant.            Pietro Cassis Alvan Dame, M.D.        01/11/2016 8:48 AM

## 2016-01-11 NOTE — Anesthesia Procedure Notes (Signed)
Spinal Patient location during procedure: OR Start time: 01/11/2016 7:15 AM End time: 01/11/2016 7:24 AM Reason for block: at surgeon's request Staffing Resident/CRNA: Christell Faith L Performed by: resident/CRNA  Preanesthetic Checklist Completed: patient identified, site marked, surgical consent, pre-op evaluation, timeout performed, IV checked, risks and benefits discussed, monitors and equipment checked and at surgeon's request Spinal Block Patient position: sitting Prep: Betadine Patient monitoring: heart rate, continuous pulse ox and blood pressure Approach: midline Location: L4-5 Injection technique: single-shot Needle Needle type: Sprotte  Needle gauge: 24 G Needle length: 10 cm Assessment Sensory level: T4 Additional Notes Expiration of kit checked and confirmed. Patient tolerated procedure well,without complications x 1 attempt with noted clear CSF. Loss of motor and sensory on exam post injection.

## 2016-01-11 NOTE — Interval H&P Note (Signed)
History and Physical Interval Note:  01/11/2016 7:05 AM  Tracie Frank  has presented today for surgery, with the diagnosis of RIGHT HIP OA  The various methods of treatment have been discussed with the patient and family. After consideration of risks, benefits and other options for treatment, the patient has consented to  Procedure(s): RIGHT TOTAL HIP ARTHROPLASTY ANTERIOR APPROACH (Right) as a surgical intervention .  The patient's history has been reviewed, patient examined, no change in status, stable for surgery.  I have reviewed the patient's chart and labs.  Questions were answered to the patient's satisfaction.     Mauri Pole

## 2016-01-11 NOTE — Transfer of Care (Signed)
Immediate Anesthesia Transfer of Care Note  Patient: Tracie Frank  Procedure(s) Performed: Procedure(s): RIGHT TOTAL HIP ARTHROPLASTY ANTERIOR APPROACH (Right)  Patient Location: PACU  Anesthesia Type:Spinal  Level of Consciousness: awake, alert  and oriented  Airway & Oxygen Therapy: Patient Spontanous Breathing and Patient connected to nasal cannula oxygen  Post-op Assessment: Report given to RN and Post -op Vital signs reviewed and stable  Post vital signs: Reviewed and stable  Last Vitals:  Filed Vitals:   01/11/16 0513  BP: 161/87  Pulse: 75  Temp: 36.6 C  Resp: 18    Last Pain: There were no vitals filed for this visit.       Complications: possible eye injury and c/o left eye discomfort.   States she feels like there is something in it.  Left eye looks red and teary.   Dr. Glennon Mac notified.

## 2016-01-11 NOTE — Anesthesia Postprocedure Evaluation (Signed)
Anesthesia Post Note  Patient: Tracie Frank  Procedure(s) Performed: Procedure(s) (LRB): RIGHT TOTAL HIP ARTHROPLASTY ANTERIOR APPROACH (Right)  Patient location during evaluation: PACU Anesthesia Type: Spinal Level of consciousness: awake Pain management: satisfactory to patient Vital Signs Assessment: post-procedure vital signs reviewed and stable Respiratory status: spontaneous breathing Cardiovascular status: blood pressure returned to baseline Postop Assessment: no headache and spinal receding Anesthetic complications: no    Last Vitals:  Filed Vitals:   01/11/16 1216 01/11/16 1321  BP: 126/70 114/63  Pulse: 74 81  Temp: 36.4 C 36.6 C  Resp: 15 14    Last Pain:  Filed Vitals:   01/11/16 1325  PainSc: 7                  Malyk Girouard EDWARD

## 2016-01-11 NOTE — Evaluation (Signed)
Physical Therapy Evaluation Patient Details Name: Tracie Frank MRN: [id number] DOB: [birth year] Today's Date: 01/11/2016   History of Present Illness  R DATHA  Clinical Impression  The patient tolerated ambulating x 175' today. Plans Dc home with family assistance. Pt admitted with above diagnosis. Pt currently with functional limitations due to the deficits listed below (see PT Problem List).  Pt will benefit from skilled PT to increase their independence and safety with mobility to allow discharge to the venue listed below.       Follow Up Recommendations Home health PT;Supervision/Assistance - 24 hour    Equipment Recommendations  None recommended by PT    Recommendations for Other Services       Precautions / Restrictions Precautions Precautions: Fall      Mobility  Bed Mobility Overal bed mobility: Needs Assistance Bed Mobility: Supine to Sit     Supine to sit: Min assist     General bed mobility comments: cues for technique  Transfers Overall transfer level: Needs assistance Equipment used: Rolling walker (2 wheeled) Transfers: Sit to/from Stand Sit to Stand: Min assist         General transfer comment: cues for ahnd and right leg position  Ambulation/Gait Ambulation/Gait assistance: Min assist Ambulation Distance (Feet): 175 Feet Assistive device: Rolling walker (2 wheeled) Gait Pattern/deviations: Step-through pattern     General Gait Details: swings right leg  well, fairly normal cadence with RW.  Stairs            Wheelchair Mobility    Modified Rankin (Stroke Patients Only)       Balance                                             Pertinent Vitals/Pain Pain Assessment: 0-10 Pain Score: 2  Pain Location: R hip, Pain Descriptors / Indicators: Tender;Aching;Discomfort Pain Intervention(s): Monitored during session;Limited activity within patient's tolerance;Premedicated before session;Repositioned;Ice applied     Home Living Family/patient expects to be discharged to:: Private residence Living Arrangements:  (sister) Available Help at Discharge: Family Type of Home: Apartment Home Access: Stairs to enter   Technical brewer of Steps: 1 Home Layout: One level Home Equipment: Environmental consultant - 2 wheels;Bedside commode      Prior Function Level of Independence: Independent with assistive device(s)               Hand Dominance        Extremity/Trunk Assessment   Upper Extremity Assessment: Defer to OT evaluation           Lower Extremity Assessment: RLE deficits/detail RLE Deficits / Details: advances the leg well during swing.    Cervical / Trunk Assessment: Normal  Communication   Communication: No difficulties  Cognition Arousal/Alertness: Awake/alert Behavior During Therapy: WFL for tasks assessed/performed Overall Cognitive Status: Within Functional Limits for tasks assessed                      General Comments      Exercises        Assessment/Plan    PT Assessment Patient needs continued PT services  PT Diagnosis Difficulty walking;Acute pain   PT Problem List Decreased strength;Decreased range of motion;Decreased activity tolerance;Decreased mobility;Decreased knowledge of precautions;Decreased knowledge of use of DME;Decreased safety awareness;Pain  PT Treatment Interventions DME instruction;Gait training;Stair training;Functional mobility training;Therapeutic activities;Therapeutic exercise;Patient/family education   PT Goals (  Current goals can be found in the Care Plan section) Acute Rehab PT Goals Patient Stated Goal: to walk without my cane PT Goal Formulation: With patient Time For Goal Achievement: 01/13/16 Potential to Achieve Goals: Good    Frequency 7X/week   Barriers to discharge        Co-evaluation               End of Session   Activity Tolerance: Patient tolerated treatment well Patient left: in chair;with call  bell/phone within reach;with chair alarm set Nurse Communication: Mobility status         Time: [id number] PT Time Calculation (min) (ACUTE ONLY): 15 min   Charges:   PT Evaluation $PT Eval Low Complexity: 1 Procedure     PT G CodesClaretha Frank 01/11/2016, 5:11 PM Tresa Endo PT [phone]

## 2016-01-12 LAB — GLUCOSE, CAPILLARY
GLUCOSE-CAPILLARY: 126 mg/dL — AB (ref 65–99)
Glucose-Capillary: 178 mg/dL — ABNORMAL HIGH (ref 65–99)

## 2016-01-12 LAB — CBC
HEMATOCRIT: 33.9 % — AB (ref 36.0–46.0)
Hemoglobin: 11.1 g/dL — ABNORMAL LOW (ref 12.0–15.0)
MCH: 32.6 pg (ref 26.0–34.0)
MCHC: 32.7 g/dL (ref 30.0–36.0)
MCV: 99.4 fL (ref 78.0–100.0)
PLATELETS: 298 10*3/uL (ref 150–400)
RBC: 3.41 MIL/uL — AB (ref 3.87–5.11)
RDW: 13.2 % (ref 11.5–15.5)
WBC: 16 10*3/uL — AB (ref 4.0–10.5)

## 2016-01-12 LAB — BASIC METABOLIC PANEL
ANION GAP: 5 (ref 5–15)
BUN: 19 mg/dL (ref 6–20)
CALCIUM: 9.3 mg/dL (ref 8.9–10.3)
CO2: 26 mmol/L (ref 22–32)
Chloride: 108 mmol/L (ref 101–111)
Creatinine, Ser: 0.67 mg/dL (ref 0.44–1.00)
GLUCOSE: 136 mg/dL — AB (ref 65–99)
POTASSIUM: 4.7 mmol/L (ref 3.5–5.1)
Sodium: 139 mmol/L (ref 135–145)

## 2016-01-12 MED ORDER — HYDROCODONE-ACETAMINOPHEN 7.5-325 MG PO TABS: 1.0000 | ORAL_TABLET | ORAL | Status: DC | PRN

## 2016-01-12 MED ORDER — FERROUS SULFATE 325 (65 FE) MG PO TABS: 325.0000 mg | ORAL_TABLET | Freq: Three times a day (TID) | ORAL | Status: DC

## 2016-01-12 MED ORDER — ASPIRIN 325 MG PO TBEC: 325.0000 mg | DELAYED_RELEASE_TABLET | Freq: Two times a day (BID) | ORAL | Status: AC

## 2016-01-12 MED ORDER — DOCUSATE SODIUM 100 MG PO CAPS: 100.0000 mg | ORAL_CAPSULE | Freq: Two times a day (BID) | ORAL | Status: DC

## 2016-01-12 MED ORDER — POLYETHYLENE GLYCOL 3350 17 G PO PACK: 17.0000 g | PACK | Freq: Two times a day (BID) | ORAL | Status: DC

## 2016-01-12 MED ORDER — METHOCARBAMOL 500 MG PO TABS: 500.0000 mg | ORAL_TABLET | Freq: Four times a day (QID) | ORAL | Status: DC | PRN

## 2016-01-12 NOTE — Progress Notes (Signed)
Physical Therapy Treatment Patient Details Name: Tracie Frank MRN: [id number] DOB: 04-13-1951 Today's Date: 01/12/2016    History of Present Illness R DATHA    PT Comments    The patient is progressing well. Plans DC after practice step.  Follow Up Recommendations  Home health PT;Supervision/Assistance - 24 hour     Equipment Recommendations  None recommended by PT    Recommendations for Other Services       Precautions / Restrictions Precautions Precautions: Fall Restrictions Weight Bearing Restrictions: No    Mobility  Bed Mobility   Bed Mobility: Supine to Sit;Sit to Supine     Supine to sit: Modified independent (Device/Increase time) Sit to supine: Modified independent (Device/Increase time)   General bed mobility comments: oob  Transfers Overall transfer level: Needs assistance Equipment used: Rolling walker (2 wheeled) Transfers: Sit to/from Stand Sit to Stand: Supervision         General transfer comment: for safety  Ambulation/Gait Ambulation/Gait assistance: Supervision Ambulation Distance (Feet): 400 Feet Assistive device: Rolling walker (2 wheeled) Gait Pattern/deviations: Step-through pattern     General Gait Details: swings right leg  well, fairly normal cadence with RW.   Stairs            Wheelchair Mobility    Modified Rankin (Stroke Patients Only)       Balance                                    Cognition Arousal/Alertness: Awake/alert Behavior During Therapy: WFL for tasks assessed/performed Overall Cognitive Status: Within Functional Limits for tasks assessed                      Exercises      General Comments        Pertinent Vitals/Pain Pain Assessment: 0-10 Pain Score: 1  Pain Location: R hip Pain Descriptors / Indicators: Tender;Sore Pain Intervention(s): Limited activity within patient's tolerance;Premedicated before session;Ice applied    Home Living Family/patient  expects to be discharged to:: Private residence Living Arrangements: Alone;Other relatives Available Help at Discharge: Family         Home Equipment: Shower seat - built in;Toilet riser;Walker - 2 wheels      Prior Function Level of Independence: Independent with assistive device(s)          PT Goals (current goals can now be found in the care plan section) Acute Rehab PT Goals Patient Stated Goal: to walk without my cane Progress towards PT goals: Progressing toward goals    Frequency  7X/week    PT Plan Current plan remains appropriate    Co-evaluation             End of Session   Activity Tolerance: Patient tolerated treatment well Patient left: in chair;with call bell/phone within reach;with chair alarm set     Time: 1033-1100 PT Time Calculation (min) (ACUTE ONLY): 27 min  Charges:  $Gait Training: 8-22 mins $Therapeutic Exercise: 8-22 mins                    G Codes:      Claretha Cooper 01/12/2016, 12:53 PM

## 2016-01-12 NOTE — Progress Notes (Signed)
Physical Therapy Treatment Patient Details Name: Tracie Frank MRN: [id number] DOB: 1951-01-01 Today's Date: 01/12/2016    History of Present Illness Tracie Frank    PT Comments    The patient practiced 1 step. Ready for DC.  Follow Up Recommendations  Home health PT;Supervision/Assistance - 24 hour     Equipment Recommendations  None recommended by PT    Recommendations for Other Services       Precautions / Restrictions Precautions Precautions: Fall    Mobility  Bed Mobility   Bed Mobility: Supine to Sit;Sit to Supine     Supine to sit: Modified independent (Device/Increase time) Sit to supine: Modified independent (Device/Increase time)      Transfers Overall transfer level: Needs assistance Equipment used: Rolling walker (2 wheeled) Transfers: Sit to/from Stand Sit to Stand: Supervision         General transfer comment: for safety  Ambulation/Gait Ambulation/Gait assistance: Supervision Ambulation Distance (Feet): 50 Feet Assistive device: Rolling walker (2 wheeled) Gait Pattern/deviations: Step-through pattern     General Gait Details: swings right leg  well, fairly normal cadence with RW.   Stairs Stairs: Yes Stairs assistance: Min assist Stair Management: Forwards;With walker Number of Stairs: 1 General stair comments: cues for safety and sequence  Wheelchair Mobility    Modified Rankin (Stroke Patients Only)       Balance                                    Cognition Arousal/Alertness: Awake/alert                          Exercises      General Comments        Pertinent Vitals/Pain Pain Score: 0-No pain Pain Location: Tracie hip Pain Descriptors / Indicators: Tender;Sore Pain Intervention(s): Limited activity within patient's tolerance;Premedicated before session;Ice applied    Home Living                      Prior Function            PT Goals (current goals can now be found in the  care plan section) Progress towards PT goals: Progressing toward goals    Frequency  7X/week    PT Plan Current plan remains appropriate    Co-evaluation             End of Session   Activity Tolerance: Patient tolerated treatment well Patient left: in chair;with call bell/phone within reach;with chair alarm set     Time: 1410-1420 PT Time Calculation (min) (ACUTE ONLY): 10 min  Charges:  $Gait Training: 8-22 mins $Therapeutic Exercise: 8-22 mins                    G Codes:      Claretha Cooper 01/12/2016, 4:12 PM

## 2016-01-12 NOTE — Evaluation (Signed)
Occupational Therapy Evaluation Patient Details Name: Tracie Frank MRN: [id number] DOB: Jan 14, 1951 Today's Date: 01/12/2016    History of Present Illness R DATHA   Clinical Impression   This 65 year old female was admitted for the above sx. All education was completed.  No further OT is needed at this time    Follow Up Recommendations  No OT follow up    Equipment Recommendations  None recommended by OT    Recommendations for Other Services       Precautions / Restrictions Precautions Precautions: Fall Restrictions Weight Bearing Restrictions: No      Mobility Bed Mobility               General bed mobility comments: oob  Transfers   Equipment used: Rolling walker (2 wheeled) Transfers: Sit to/from Stand Sit to Stand: Supervision         General transfer comment: for safety    Balance                                            ADL Overall ADL's : Needs assistance/impaired     Grooming: Wash/dry hands;Oral care;Supervision/safety;Standing   Upper Body Bathing: Set up;Sitting   Lower Body Bathing: Minimal assistance;Sit to/from stand   Upper Body Dressing : Set up;Sitting   Lower Body Dressing: Moderate assistance;Sit to/from stand   Toilet Transfer: Min guard;Ambulation;BSC;RW   Toileting- Water quality scientist and Hygiene: Min guard;Sit to/from stand   Tub/ Shower Transfer: Walk-in shower;Min guard;Ambulation;3 in 1     General ADL Comments: Pt's sister will assist her at home as needed. She has a Secondary school teacher, but doesn't know where it is.  shower seat is built in and she will make sure sister is there when she showers     Vision     Perception     Praxis      Pertinent Vitals/Pain Pain Assessment: 0-10 Pain Score: 2  Pain Location: R hip Pain Descriptors / Indicators: Sore Pain Intervention(s): Limited activity within patient's tolerance;Monitored during session;Premedicated before session;Repositioned  (declined ice)     Hand Dominance     Extremity/Trunk Assessment Upper Extremity Assessment Upper Extremity Assessment: Overall WFL for tasks assessed           Communication Communication Communication: No difficulties   Cognition Arousal/Alertness: Awake/alert Behavior During Therapy: WFL for tasks assessed/performed Overall Cognitive Status: Within Functional Limits for tasks assessed                     General Comments       Exercises       Shoulder Instructions      Home Living Family/patient expects to be discharged to:: Private residence Living Arrangements: Alone;Other relatives Available Help at Discharge: Family               Bathroom Shower/Tub: Walk-in Corporate treasurer Toilet: Handicapped height     Home Equipment: Shower seat - built in;Toilet riser;Walker - 2 wheels          Prior Functioning/Environment Level of Independence: Independent with assistive device(s)             OT Diagnosis: Acute pain   OT Problem List:     OT Treatment/Interventions:      OT Goals(Current goals can be found in the care plan section) Acute Rehab OT Goals Patient Stated Goal:  to walk without my cane  OT Frequency:     Barriers to D/C:            Co-evaluation              End of Session    Activity Tolerance: Patient tolerated treatment well Patient left: in chair;with call bell/phone within reach;with chair alarm set   Time: [id number] OT Time Calculation (min): 20 min Charges:  OT General Charges $OT Visit: 1 Procedure OT Evaluation $OT Eval Low Complexity: 1 Procedure G-Codes:    Kimmie Doren 2016/01/16, 10:16 AM  Lesle Chris, OTR/L [phone] Jan 16, 2016

## 2016-01-12 NOTE — Care Management Note (Signed)
Case Management Note  Patient Details  Name: Jozy Mcphearson MRN: [id number] Date of Birth: [birth year]  Subjective/Objective:                  RIGHT TOTAL HIP ARTHROPLASTY ANTERIOR APPROACH (Right) Action/Plan: Discharge planning Expected Discharge Date:     01/13/16            Expected Discharge Plan:  [place]  In-House Referral:     Discharge planning Services  CM Consult  Post Acute Care Choice:    Choice offered to:  Patient  DME Arranged:  N/A DME Agency:  NA  HH Arranged:  PT HH Agency:  [facility]  Status of Service:  Completed, signed off  Medicare Important Message Given:    Date Medicare IM Given:    Medicare IM give by:    Date Additional Medicare IM Given:    Additional Medicare Important Message give by:     If discussed at [place] of Stay Meetings, dates discussed:    Additional Comments: CM met with pt in room to offer choice of home health agency.  Pt chooses [facility] to render HHPT. Referral given to [employer], Tim.  Pt has a rolling walker at home and declines the need for a 3n1.  No other CM needs were communicated. Dellie Catholic, RN 01/12/2016, 10:51 AM

## 2016-01-12 NOTE — Progress Notes (Signed)
     Subjective: 1 Day Post-Op Procedure(s) (LRB): RIGHT TOTAL HIP ARTHROPLASTY ANTERIOR APPROACH (Right)   Patient reports pain as mild, pain controlled.  Feels that she is doing quite well.  No events throughout the night.  Ready to be discharged home.  Objective:   VITALS:   Filed Vitals:   01/12/16 0100 01/12/16 0600  BP: 100/56 128/78  Pulse: 63 66  Temp: 98 F (36.7 C) 98 F (36.7 C)  Resp: 16 18    Dorsiflexion/Plantar flexion intact Incision: dressing C/D/I No cellulitis present Compartment soft  LABS  Recent Labs  01/12/16 0425  HGB 11.1*  HCT 33.9*  WBC 16.0*  PLT 298     Recent Labs  01/12/16 0425  NA 139  K 4.7  BUN 19  CREATININE 0.67  GLUCOSE 136*     Assessment/Plan: 1 Day Post-Op Procedure(s) (LRB): RIGHT TOTAL HIP ARTHROPLASTY ANTERIOR APPROACH (Right) Foley cath d/c'ed Advance diet Up with therapy D/C IV fluids Discharge home with home health  Follow up in 2 weeks at [facility]. Follow up with OLIN,Trystyn Dolley D in 2 weeks.  Contact information:  [facility] [address] [place] [address] [phone]    Morbid Obesity (BMI >40)  Estimated body mass index is 42.79 kg/(m^2) as calculated from the following:   Height as of this encounter: 5\' 2"  (1.575 m).   Weight as of this encounter: 106.142 kg (234 lb). Patient also counseled that weight may inhibit the healing process Patient counseled that losing weight will help with future health issues         West Pugh. Kourtney Terriquez   PAC  01/12/2016, 8:19 AM

## 2016-01-12 NOTE — Discharge Instructions (Signed)

## 2016-01-17 NOTE — Discharge Summary (Signed)
Physician Discharge Summary  Patient ID: Tracie Frank MRN: [id number] DOB/AGE: 09/16/1950 65 y.o.  Admit date: 01/11/2016 Discharge date: 01/12/2016   Procedures:  Procedure(s) (LRB): RIGHT TOTAL HIP ARTHROPLASTY ANTERIOR APPROACH (Right)  Attending Physician:  Dr. Paralee Cancel   Admission Diagnoses:   Right hip primary PA /pain  Discharge Diagnoses:  Principal Problem:   S/P right THA, AA Active Problems:   Morbid obesity ([place])  Past Medical History  Diagnosis Date  . Allergy   . Cushing's disease ([place])   . Hyperlipidemia   . Anxiety   . Arthritis   . Depression   . PONV (postoperative nausea and vomiting)   . Hypertension     pt states was on BP meds in past but is currently not having to take medications   . Wears glasses   . Pneumonia   . Diabetes mellitus 10 years ago    pt states is currently not taking any medications for DM  . History of frequent urinary tract infections   . Motion sickness     HPI:    Tracie Frank, 65 y.o. female, has a history of pain and functional disability in the right hip(s) due to arthritis and patient has failed non-surgical conservative treatments for greater than 12 weeks to include NSAID's and/or analgesics, use of assistive devices and activity modification. Onset of symptoms was gradual starting 2+ years ago with gradually worsening course since that time.The patient noted no past surgery on the right hip(s). Patient currently rates pain in the right hip at 10 out of 10 with activity. Patient has worsening of pain with activity and weight bearing, trendelenberg gait, pain that interfers with activities of daily living and pain with passive range of motion. Patient has evidence of periarticular osteophytes and joint space narrowing by imaging studies. This condition presents safety issues increasing the risk of falls. There is no current active infection. Risks, benefits and expectations were discussed with the patient. Risks  including but not limited to the risk of anesthesia, blood clots, nerve damage, blood vessel damage, failure of the prosthesis, infection and up to and including death. Patient understand the risks, benefits and expectations and wishes to proceed with surgery.   PCP: Geanie Cooley., MD   Discharged Condition: good  Hospital Course:  Patient underwent the above stated procedure on 01/11/2016. Patient tolerated the procedure well and brought to the recovery room in good condition and subsequently to the floor.  POD #1 BP: 128/78 ; Pulse: 66 ; Temp: 98 F (36.7 C) ; Resp: 18 Patient reports pain as mild, pain controlled. Feels that she is doing quite well. No events throughout the night. Ready to be discharged home. Dorsiflexion/plantar flexion intact, incision: dressing C/D/I, no cellulitis present and compartment soft.   LABS  Basename    HGB     11.1  HCT     33.9     Discharge Exam: General appearance: alert, cooperative and no distress Extremities: Homans sign is negative, no sign of DVT, no edema, redness or tenderness in the calves or thighs and no ulcers, gangrene or trophic changes  Disposition: Home with follow up in 2 weeks   Follow-up Information    Follow up with Mauri Pole, MD. Schedule an appointment as soon as possible for a visit in 2 weeks.   Specialty:  Orthopedic Surgery   Contact information:   [address] [place] [address] [phone]       Follow up with [facility].  Why:  home health physical therapy   Contact information:   [address] [place] [address] [phone]       Follow up with [facility].   Contact information:   [place]  [address] [phone]       Discharge Instructions    Call MD / Call 911    Complete by:  As directed   If you experience chest pain or shortness of breath, CALL 911 and be transported to the hospital emergency room.   If you develope a fever above 101 F, pus (white drainage) or increased drainage or redness at the wound, or calf pain, call your surgeon's office.     Change dressing    Complete by:  As directed   Maintain surgical dressing until follow up in the clinic. If the edges start to pull up, may reinforce with tape. If the dressing is no longer working, may remove and cover with gauze and tape, but must keep the area dry and clean.  Call with any questions or concerns.     Constipation Prevention    Complete by:  As directed   Drink plenty of fluids.  Prune juice may be helpful.  You may use a stool softener, such as Colace (over the counter) 100 mg twice a day.  Use MiraLax (over the counter) for constipation as needed.     Diet - low sodium heart healthy    Complete by:  As directed      Discharge instructions    Complete by:  As directed   Maintain surgical dressing until follow up in the clinic. If the edges start to pull up, may reinforce with tape. If the dressing is no longer working, may remove and cover with gauze and tape, but must keep the area dry and clean.  Follow up in 2 weeks at [facility]. Call with any questions or concerns.     Increase activity slowly as tolerated    Complete by:  As directed   Weight bearing as tolerated with assist device (walker, cane, etc) as directed, use it as long as suggested by your surgeon or therapist, typically at least 4-6 weeks.     TED hose    Complete by:  As directed   Use stockings (TED hose) for 2 weeks on both leg(s).  You may remove them at night for sleeping.             Medication List    STOP taking these medications        acetaminophen 650 MG CR tablet  Commonly known as:  TYLENOL     diclofenac 75 MG EC tablet  Commonly known as:  VOLTAREN     traMADol 50 MG tablet  Commonly known as:  ULTRAM      TAKE these medications        ALPRAZolam 0.25 MG tablet  Commonly known as:  XANAX  Take 0.25 mg by mouth at  bedtime as needed for anxiety or sleep.     aspirin 325 MG EC tablet  Take 1 tablet (325 mg total) by mouth 2 (two) times daily.     Calcium [phone] Tabs  Take 3 tablets by mouth daily.     diphenhydrAMINE 25 MG tablet  Commonly known as:  BENADRYL  Take 50 mg by mouth every morning.     docusate sodium 100 MG capsule  Commonly known as:  COLACE  Take  1 capsule (100 mg total) by mouth 2 (two) times daily.     ferrous sulfate 325 (65 FE) MG tablet  Take 1 tablet (325 mg total) by mouth 3 (three) times daily after meals.     FLUoxetine 40 MG capsule  Commonly known as:  PROZAC  Take 40 mg by mouth daily.     HYDROcodone-acetaminophen 7.5-325 MG tablet  Commonly known as:  NORCO  Take 1-2 tablets by mouth every 4 (four) hours as needed for moderate pain.     methocarbamol 500 MG tablet  Commonly known as:  ROBAXIN  Take 1 tablet (500 mg total) by mouth every 6 (six) hours as needed for muscle spasms.     OVER THE COUNTER MEDICATION  Take 1 tablet by mouth daily as needed. Prune lax     polyethylene glycol packet  Commonly known as:  MIRALAX / GLYCOLAX  Take 17 g by mouth 2 (two) times daily.     pravastatin 80 MG tablet  Commonly known as:  PRAVACHOL  Take 80 mg by mouth daily.     VITAMIN D PO  Take 5,000 mg by mouth daily.     zolpidem 10 MG tablet  Commonly known as:  AMBIEN  Take 10 mg by mouth at bedtime as needed for sleep.         Signed: West Pugh. Burleigh Brockmann   PA-C  01/17/2016, 11:11 AM

## 2018-01-11 NOTE — H&P (Addendum)
Patient ID: Tracie Frank MRN: [id number] DOB/AGE: June 12, 1951 67 y.o.  Admit date: (Not on file)  Admission Diagnoses:  Chronic pain syndrome  HPI: Very pleasant 67 year old female patient presents to clinic for her history and physical prior to have any spinal cord stimulator placed.  Patient reports a history of sleep apnea..  Past Medical History: Past Medical History:  Diagnosis Date  . Allergy   . Anxiety   . Arthritis   . Cushing's disease ([place])   . Depression   . Diabetes mellitus 10 years ago   pt states is currently not taking any medications for DM  . History of frequent urinary tract infections   . Hyperlipidemia   . Hypertension    pt states was on BP meds in past but is currently not having to take medications   . Motion sickness   . Pneumonia   . PONV (postoperative nausea and vomiting)   . Wears glasses     Surgical History: Past Surgical History:  Procedure Laterality Date  . ADRENALECTOMY  ~8-10   Left  . bladder polyp    . ENDOMETRIAL ABLATION    . JOINT REPLACEMENT     left knee 2013  . TONSILLECTOMY    . TONSILLECTOMY AND ADENOIDECTOMY    . TOTAL HIP ARTHROPLASTY Right 01/11/2016   Procedure: RIGHT TOTAL HIP ARTHROPLASTY ANTERIOR APPROACH;  Surgeon: Paralee Cancel, MD;  Location: [facility];  Service: Orthopedics;  Laterality: Right;    Family History: Family History  Problem Relation Age of Onset  . Cancer Maternal Aunt        leukemia  . Heart disease Maternal Grandmother   . Stroke Maternal Grandmother     Social History: Social History   Socioeconomic History  . Marital status: Married    Spouse name: Not on file  . Number of children: Not on file  . Years of education: Not on file  . Highest education level: Not on file  Occupational History  . Not on file  Social Needs  . Financial resource strain: Not on file  . Food insecurity:    Worry: Not on file    Inability: Not on file  . Transportation needs:    Medical: Not  on file    Non-medical: Not on file  Tobacco Use  . Smoking status: Former Smoker    Packs/day: 1.00    Years: 15.00    Pack years: 15.00    Types: Cigarettes    Last attempt to quit: 03/16/2000    Years since quitting: 17.8  . Smokeless tobacco: Never Used  Substance and Sexual Activity  . Alcohol use: No  . Drug use: No  . Sexual activity: Never    Birth control/protection: None  Lifestyle  . Physical activity:    Days per week: Not on file    Minutes per session: Not on file  . Stress: Not on file  Relationships  . Social connections:    Talks on phone: Not on file    Gets together: Not on file    Attends religious service: Not on file    Active member of club or organization: Not on file    Attends meetings of clubs or organizations: Not on file    Relationship status: Not on file  . Intimate partner violence:    Fear of current or ex partner: Not on file    Emotionally abused: Not on file    Physically abused: Not on file  Forced sexual activity: Not on file  Other Topics Concern  . Not on file  Social History Narrative  . Not on file    Allergies: Gabapentin; Lyrica [pregabalin]; Morphine and related; and Other  Medications: I have reviewed the patient's current medications.  Vital Signs: No data found.  Radiology: No results found.  Labs: No results for input(s): WBC, RBC, HCT, PLT in the last 72 hours. No results for input(s): NA, K, CL, CO2, BUN, CREATININE, GLUCOSE, CALCIUM in the last 72 hours. No results for input(s): LABPT, INR in the last 72 hours.  Review of Systems: ROS  Physical Exam: There is no height or weight on file to calculate BMI.  Physical Exam  Constitutional: She is oriented to person, place, and time. She appears well-developed and well-nourished.  HENT:  Head: Normocephalic.  Cardiovascular: Normal rate and regular rhythm.  Respiratory: Effort normal and breath sounds normal.  GI: Soft. Bowel sounds are normal.    Neurological: She is alert and oriented to person, place, and time.  Skin: Skin is warm and dry.  Psychiatric: She has a normal mood and affect. Her behavior is normal. Judgment and thought content normal.   Peripheral pulses: 1+ and symmetrical in the lower extremity. Compartment soft and nontender.  Gait pattern: Patient does ambulate with a slight limp and occasionally uses a cane for support.  Assistive devices: Cane  Neuro: Grossly neurologically intact with no focal motor deficits she does have intermittent dysesthesias in the lower legs especially on the left side.  Negative Babinski test, no clonus, 1+ deep tendon reflexes in the knee and the Achilles.  Negative straight nerve root tension signs.  Musculoskeletal: She continues to have significant back pain buttock pain radiating into the left lower extremity.  No hip, knee, ankle pain with isolated joint range of motion.  No SI joint pain.  Her clinical exam is essentially unchanged from her initial evaluation by me on 08/28/17  Thoracic MRI from 12/24/17: She has a mild scoliosis of the lower spine apex centered at T9.  No cord signal changes.  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  Moderate flattening of the canal at T6-7 due to a hard disc osteophyte.  Moderate indentation and mild stenosis at T3-4 mild stenosis T9-10 small left disc protrusion 2-3 small disc protrusion T5-6.   Assessment and Plan: Risks and benefits of surgery were discussed with the patient. These include: Infection, bleeding, death, stroke, paralysis, ongoing or worse pain, need for additional surgery, leak of spinal fluid, Failure of the battery requiring reoperation. Inability to place the paddle requiring the surgery to be aborted. Migration of the lead, failure to obtain results similar to the trial.  Goal of surgery: Reproduce pain relief obtained during the trial. Improved quality of  life.  Ronette Deter, PAC for Melina Schools, MD [facility] [phone]  Patient has chronic low back pain with significant lower extremity pain.  Despite appropriate conservative management her quality of life continues to deteriorate.  Ultimately she had a spinal cord stimulator trial which provided significant relief and improvement.  As result she presented to me for permanent implantation.  Patient's clinical exam is unchanged today.  Surgical plan is a T10 laminotomy for T9 placement of the lead, and then right-sided battery placement.  I have again gone over the risks and benefits of surgery with the patient and her sister and all  their questions have been addressed.  Postoperative plan is for overnight admission unless the patient is doing exceptionally well and meets all appropriate discharge criteria and expresses a desire to move go home later today.

## 2018-01-14 ENCOUNTER — Encounter (HOSPITAL_COMMUNITY): Payer: Self-pay

## 2018-01-14 NOTE — Pre-Procedure Instructions (Addendum)
Tracie Frank  01/14/2018      [id number] - [place], [address] AT [place] [address] [place] [address] Phone: [phone] Fax: [phone]    Your procedure is scheduled on Wed., January 23, 2018 from 8:30AM-11:30AM  Report to [facility] Admitting Entrance "A" at 6:30AM  Call this number if you have problems the morning of surgery:  [phone]   Remember:  No food or drinks after midnight on June 18th    Take these medicines the morning of surgery with A SIP OF WATER: FLUoxetine (PROZAC). If needed HYDROcodone-acetaminophen (NORCO/VICODIN),  Dicyclomine (BENTYL), and Cetirizine (ZYRTEC)   Follow your doctors instructions regarding your Aspirin.  If no instructions were given by your doctor, then you will need to call the prescribing office office to get instructions.    7 days before surgery (6/12), stop taking all Other Aspirin Products, Vitamins, Fish oils, and Herbal medications. Also stop all NSAIDS i.e. Advil, Ibuprofen, Motrin, Aleve, Anaprox, Naproxen, BC, Goody Powders, and all Supplements.  How to Manage Your Diabetes Before and After Surgery  Why is it important to control my blood sugar before and after surgery? . Improving blood sugar levels before and after surgery helps healing and can limit problems. . A way of improving blood sugar control is eating a healthy diet by: o  Eating less sugar and carbohydrates o  Increasing activity/exercise o  Talking with your doctor about reaching your blood sugar goals . High blood sugars (greater than 180 mg/dL) can raise your risk of infections and slow your recovery, so you will need to focus on controlling your diabetes during the weeks before surgery. . Make sure that the doctor who takes care of your diabetes knows about your planned surgery including the date and location.  How do I manage my blood sugar before surgery? . Check your blood  sugar at least 4 times a day, starting 2 days before surgery, to make sure that the level is not too high or low. o Check your blood sugar the morning of your surgery when you wake up and every 2 hours until you get to the Short Stay unit. . If your blood sugar is less than 70 mg/dL, you will need to treat for low blood sugar: o Do not take insulin. o Treat a low blood sugar (less than 70 mg/dL) with  cup of clear juice (cranberry or apple), 4 glucose tablets, OR glucose gel. Recheck blood sugar in 15 minutes after treatment (to make sure it is greater than 70 mg/dL). If your blood sugar is not greater than 70 mg/dL on recheck, call [phone] o  for further instructions. . Report your blood sugar to the short stay nurse when you get to Short Stay.  . If you are admitted to the hospital after surgery: o Your blood sugar will be checked by the staff and you will probably be given insulin after surgery (instead of oral diabetes medicines) to make sure you have good blood sugar levels. o The goal for blood sugar control after surgery is 80-180 mg/dL.  . If your CBG is greater than 220 mg/dL, inform the staff upon arrival to Short Stay  Reviewed and Endorsed by [facility] Patient Education Committee, August 2015    Do not wear jewelry, make-up or nail polish.  Do not wear lotions, powders, or perfumes, or deodorant.  Do not shave 48 hours  prior to surgery.    Do not bring valuables to the hospital.  [facility] is not responsible for any belongings or valuables.  Contacts, dentures or bridgework may not be worn into surgery.  Leave your suitcase in the car.  After surgery it may be brought to your room.  For patients admitted to the hospital, discharge time will be determined by your treatment team.  Patients discharged the day of surgery will not be allowed to drive home.   Special instructions:   [facility]- Preparing For Surgery  Before surgery, you can play an important role.  Because skin is not sterile, your skin needs to be as free of germs as possible. You can reduce the number of germs on your skin by washing with CHG (chlorahexidine gluconate) Soap before surgery.  CHG is an antiseptic cleaner which kills germs and bonds with the skin to continue killing germs even after washing.    Oral Hygiene is also important to reduce your risk of infection.  Remember - BRUSH YOUR TEETH THE MORNING OF SURGERY WITH YOUR REGULAR TOOTHPASTE  Please do not use if you have an allergy to CHG or antibacterial soaps. If your skin becomes reddened/irritated stop using the CHG.  Do not shave (including legs and underarms) for at least 48 hours prior to first CHG shower. It is OK to shave your face.  Please follow these instructions carefully.   1. Shower the NIGHT BEFORE SURGERY and the MORNING OF SURGERY with CHG.   2. If you chose to wash your hair, wash your hair first as usual with your normal shampoo.  3. After you shampoo, rinse your hair and body thoroughly to remove the shampoo.  4. Use CHG as you would any other liquid soap. You can apply CHG directly to the skin and wash gently with a scrungie or a clean washcloth.   5. Apply the CHG Soap to your body ONLY FROM THE NECK DOWN.  Do not use on open wounds or open sores. Avoid contact with your eyes, ears, mouth and genitals (private parts). Wash Face and genitals (private parts)  with your normal soap.  6. Wash thoroughly, paying special attention to the area where your surgery will be performed.  7. Thoroughly rinse your body with warm water from the neck down.  8. DO NOT shower/wash with your normal soap after using and rinsing off the CHG Soap.  9. Pat yourself dry with a CLEAN TOWEL.  10. Wear CLEAN PAJAMAS to bed the night before surgery, wear comfortable clothes the morning of surgery  11. Place CLEAN SHEETS on your bed the night of your first shower and DO NOT SLEEP WITH PETS.  Day of Surgery:  Do not apply  any deodorants/lotions.  Please wear clean clothes to the hospital/surgery center.   Remember to brush your teeth WITH YOUR REGULAR TOOTHPASTE.  Please read over the following fact sheets that you were given. Pain Booklet, Coughing and Deep Breathing, MRSA Information and Surgical Site Infection Prevention

## 2018-01-15 ENCOUNTER — Encounter (HOSPITAL_COMMUNITY): Payer: Self-pay

## 2018-01-15 ENCOUNTER — Other Ambulatory Visit: Payer: Self-pay

## 2018-01-15 ENCOUNTER — Encounter (HOSPITAL_COMMUNITY)
Admission: RE | Admit: 2018-01-15 | Discharge: 2018-01-15 | Disposition: A | Discharge status: Home / Self Care | Payer: Medicare Other | Source: Ambulatory Visit | Attending: Orthopedic Surgery | Admitting: Orthopedic Surgery

## 2018-01-15 DIAGNOSIS — E119 Type 2 diabetes mellitus without complications: Secondary | ICD-10-CM | POA: Diagnosis not present

## 2018-01-15 DIAGNOSIS — Z01812 Encounter for preprocedural laboratory examination: Secondary | ICD-10-CM | POA: Insufficient documentation

## 2018-01-15 HISTORY — DX: Headache: R51

## 2018-01-15 HISTORY — DX: Headache, unspecified: R51.9

## 2018-01-15 HISTORY — DX: Peripheral vascular disease, unspecified: I73.9

## 2018-01-15 HISTORY — DX: Sleep apnea, unspecified: G47.30

## 2018-01-15 LAB — CBC
HCT: 44.9 % (ref 36.0–46.0)
Hemoglobin: 15.2 g/dL — ABNORMAL HIGH (ref 12.0–15.0)
MCH: 33.8 pg (ref 26.0–34.0)
MCHC: 33.9 g/dL (ref 30.0–36.0)
MCV: 99.8 fL (ref 78.0–100.0)
PLATELETS: 331 10*3/uL (ref 150–400)
RBC: 4.5 MIL/uL (ref 3.87–5.11)
RDW: 12.6 % (ref 11.5–15.5)
WBC: 9.7 10*3/uL (ref 4.0–10.5)

## 2018-01-15 LAB — BASIC METABOLIC PANEL
Anion gap: 9 (ref 5–15)
BUN: 19 mg/dL (ref 6–20)
CALCIUM: 10.2 mg/dL (ref 8.9–10.3)
CO2: 24 mmol/L (ref 22–32)
CREATININE: 0.91 mg/dL (ref 0.44–1.00)
Chloride: 108 mmol/L (ref 101–111)
GFR calc non Af Amer: >60 mL/min (ref >60)
Glucose, Bld: 121 mg/dL — ABNORMAL HIGH (ref 65–99)
Potassium: 4 mmol/L (ref 3.5–5.1)
SODIUM: 141 mmol/L (ref 135–145)

## 2018-01-15 LAB — SURGICAL PCR SCREEN
MRSA, PCR: NEGATIVE
Staphylococcus aureus: NEGATIVE

## 2018-01-15 LAB — HEMOGLOBIN A1C
HEMOGLOBIN A1C: 6.5 % — AB (ref 4.8–5.6)
MEAN PLASMA GLUCOSE: 139.85 mg/dL

## 2018-01-15 LAB — GLUCOSE, CAPILLARY: GLUCOSE-CAPILLARY: 137 mg/dL — AB (ref 65–99)

## 2018-01-15 NOTE — Progress Notes (Signed)
PCP is Dr. Sarah [place] Denies seeing a cardiologist Denies ever having a card cath and echo. States she can't remember if she had a stress test or not, but if she did it was years ago, and it was normal. Denies chest pain, cough, or fever. Reports that she doesn't check her CBG's often, but usually runs around 130's. States she will stop taking aspirin 01-18-18 Has sleep apnea, but doesn't wear CPAP.

## 2018-01-16 NOTE — Progress Notes (Signed)
Anesthesia Chart Review:   Case:  [id number] Date/Time:  01/23/18 0815   Procedure:  LUMBAR SPINAL CORD STIMULATOR INSERTION (N/A ) - 3 hrs   Anesthesia type:  General   Pre-op diagnosis:  Chronic pain syndrome   Location:  [facility] ROOM 04 / [facility]   Surgeon:  Melina Schools, MD      DISCUSSION: - Pt is a 67 year old female with hx HTN, DM, OSA, Cushings (s/p adrenalectomy)   VS: BP 129/78   Pulse (!) 59   Temp 36.6 C   Resp 20   Ht 5\' 3"  (1.6 m)   Wt 237 lb 8 oz (107.7 kg)   SpO2 95%   BMI 42.07 kg/m   PROVIDERS: PCP is [place], Sarah T, MD   LABS: Labs reviewed: Acceptable for surgery. (all labs ordered are listed, but only abnormal results are displayed)  Labs Reviewed  GLUCOSE, CAPILLARY - Abnormal; Notable for the following components:      Result Value   Glucose-Capillary 137 (*)    All other components within normal limits  HEMOGLOBIN A1C - Abnormal; Notable for the following components:   Hgb A1c MFr Bld 6.5 (*)    All other components within normal limits  BASIC METABOLIC PANEL - Abnormal; Notable for the following components:   Glucose, Bld 121 (*)    All other components within normal limits  CBC - Abnormal; Notable for the following components:   Hemoglobin 15.2 (*)    All other components within normal limits  SURGICAL PCR SCREEN    EKG 01/15/18: Sinus bradycardia (53 bpm). Minimal voltage criteria for LVH, may be normal variant. Nonspecific T wave abnormality   Past Medical History:  Diagnosis Date  . Allergy   . Anxiety   . Arthritis   . Cushing's disease ([place])   . Depression   . Diabetes mellitus 10 years ago   pt states is currently not taking any medications for DM  . Headache   . History of frequent urinary tract infections   . Hyperlipidemia   . Hypertension    pt states was on BP meds in past but is currently not having to take medications   . Motion sickness   . Peripheral vascular disease (HCC)    right leg sig. and left leg moderate   . Pneumonia   . PONV (postoperative nausea and vomiting)   . Sleep apnea   . Wears glasses     Past Surgical History:  Procedure Laterality Date  . ADRENALECTOMY  ~8-10   Left  . bladder polyp    . COLONOSCOPY    . ENDOMETRIAL ABLATION    . HERNIA REPAIR    . JOINT REPLACEMENT     left knee 2013  . TONSILLECTOMY    . TONSILLECTOMY AND ADENOIDECTOMY    . TOTAL HIP ARTHROPLASTY Right 01/11/2016   Procedure: RIGHT TOTAL HIP ARTHROPLASTY ANTERIOR APPROACH;  Surgeon: Paralee Cancel, MD;  Location: [facility];  Service: Orthopedics;  Laterality: Right;    MEDICATIONS: . ALPRAZolam (XANAX) 0.25 MG tablet  . aspirin EC 81 MG tablet  . atorvastatin (LIPITOR) 40 MG tablet  . Calcium Carbonate-Vitamin D (CALTRATE 600+D) 600-400 MG-UNIT tablet  . cetirizine (ZYRTEC) 10 MG tablet  . Cholecalciferol (VITAMIN D3) 2000 units TABS  . dicyclomine (BENTYL) 20 MG tablet  . docusate sodium (COLACE) 100 MG capsule  . FLUoxetine (PROZAC) 40 MG capsule  . HYDROcodone-acetaminophen (NORCO/VICODIN) 5-325 MG tablet  . ketotifen (ZADITOR) 0.025 %  ophthalmic solution  . L-Lysine 500 MG TABS  . lisinopril (PRINIVIL,ZESTRIL) 10 MG tablet  . Multiple Vitamins-Minerals (HEALTHY EYES PO)  . oxybutynin (DITROPAN-XL) 10 MG 24 hr tablet  . vitamin C (ASCORBIC ACID) 500 MG tablet  . zolpidem (AMBIEN) 10 MG tablet   No current facility-administered medications for this encounter.     If no changes, I anticipate pt can proceed with surgery as scheduled.   Willeen Cass, FNP-BC [facility]/Anesthesiology Phone: [phone] 01/16/2018 3:17 PM

## 2018-01-23 ENCOUNTER — Observation Stay (HOSPITAL_COMMUNITY)
Admission: RE | Admit: 2018-01-23 | Discharge: 2018-01-24 | Disposition: A | Discharge status: Home / Self Care | Payer: Medicare Other | Source: Ambulatory Visit | Attending: Orthopedic Surgery | Admitting: Orthopedic Surgery

## 2018-01-23 ENCOUNTER — Encounter (HOSPITAL_COMMUNITY): Payer: Self-pay | Admitting: Urology

## 2018-01-23 ENCOUNTER — Ambulatory Visit (HOSPITAL_COMMUNITY): Payer: Medicare Other | Admitting: Certified Registered Nurse Anesthetist

## 2018-01-23 ENCOUNTER — Ambulatory Visit (HOSPITAL_COMMUNITY): Payer: Medicare Other

## 2018-01-23 ENCOUNTER — Ambulatory Visit (HOSPITAL_COMMUNITY): Payer: Medicare Other | Admitting: Emergency Medicine

## 2018-01-23 ENCOUNTER — Encounter (HOSPITAL_COMMUNITY)
Admission: RE | Disposition: A | Discharge status: Home / Self Care | Payer: Self-pay | Source: Ambulatory Visit | Attending: Orthopedic Surgery

## 2018-01-23 DIAGNOSIS — G894 Chronic pain syndrome: Secondary | ICD-10-CM | POA: Diagnosis present

## 2018-01-23 DIAGNOSIS — Z6841 Body Mass Index (BMI) 40.0 and over, adult: Secondary | ICD-10-CM | POA: Diagnosis not present

## 2018-01-23 DIAGNOSIS — I10 Essential (primary) hypertension: Secondary | ICD-10-CM | POA: Insufficient documentation

## 2018-01-23 DIAGNOSIS — Z9689 Presence of other specified functional implants: Secondary | ICD-10-CM

## 2018-01-23 DIAGNOSIS — R32 Unspecified urinary incontinence: Secondary | ICD-10-CM | POA: Insufficient documentation

## 2018-01-23 DIAGNOSIS — Z96641 Presence of right artificial hip joint: Secondary | ICD-10-CM | POA: Insufficient documentation

## 2018-01-23 DIAGNOSIS — G4730 Sleep apnea, unspecified: Secondary | ICD-10-CM | POA: Insufficient documentation

## 2018-01-23 DIAGNOSIS — Z9889 Other specified postprocedural states: Secondary | ICD-10-CM

## 2018-01-23 DIAGNOSIS — Z87891 Personal history of nicotine dependence: Secondary | ICD-10-CM | POA: Diagnosis not present

## 2018-01-23 DIAGNOSIS — E1151 Type 2 diabetes mellitus with diabetic peripheral angiopathy without gangrene: Secondary | ICD-10-CM | POA: Diagnosis not present

## 2018-01-23 DIAGNOSIS — Z419 Encounter for procedure for purposes other than remedying health state, unspecified: Secondary | ICD-10-CM

## 2018-01-23 HISTORY — PX: SPINAL CORD STIMULATOR INSERTION: SHX5378

## 2018-01-23 LAB — GLUCOSE, CAPILLARY
GLUCOSE-CAPILLARY: 228 mg/dL — AB (ref 65–99)
Glucose-Capillary: 131 mg/dL — ABNORMAL HIGH (ref 65–99)
Glucose-Capillary: 157 mg/dL — ABNORMAL HIGH (ref 65–99)
Glucose-Capillary: 200 mg/dL — ABNORMAL HIGH (ref 65–99)

## 2018-01-23 SURGERY — INSERTION, SPINAL CORD STIMULATOR, LUMBAR
Anesthesia: General | Site: Spine Lumbar

## 2018-01-23 MED ORDER — PROPOFOL 10 MG/ML IV BOLUS
INTRAVENOUS | Status: AC
Start: 2018-01-23 — End: ?
  Filled 2018-01-23: qty 20

## 2018-01-23 MED ORDER — HYDROCODONE-ACETAMINOPHEN 5-325 MG PO TABS
1.0000 | ORAL_TABLET | ORAL | Status: DC | PRN
  Administered 2018-01-24: 1 via ORAL
  Filled 2018-01-23: qty 1

## 2018-01-23 MED ORDER — DOCUSATE SODIUM 100 MG PO CAPS
100.0000 mg | ORAL_CAPSULE | Freq: Two times a day (BID) | ORAL | Status: DC
  Administered 2018-01-23 – 2018-01-24 (×3): 100 mg via ORAL
  Filled 2018-01-23 (×3): qty 1

## 2018-01-23 MED ORDER — 0.9 % SODIUM CHLORIDE (POUR BTL) OPTIME
TOPICAL | Status: DC | PRN
  Administered 2018-01-23: 1000 mL

## 2018-01-23 MED ORDER — ACETAMINOPHEN 650 MG RE SUPP: 650.0000 mg | RECTAL | Status: DC | PRN

## 2018-01-23 MED ORDER — ONDANSETRON HCL 4 MG/2ML IJ SOLN
INTRAMUSCULAR | Status: DC | PRN
  Administered 2018-01-23: 4 mg via INTRAVENOUS

## 2018-01-23 MED ORDER — HEMOSTATIC AGENTS (NO CHARGE) OPTIME
TOPICAL | Status: DC | PRN
  Administered 2018-01-23: 1 via TOPICAL

## 2018-01-23 MED ORDER — METHOCARBAMOL 500 MG PO TABS
ORAL_TABLET | ORAL | Status: AC
  Filled 2018-01-23: qty 1

## 2018-01-23 MED ORDER — POLYETHYLENE GLYCOL 3350 17 G PO PACK: 17.0000 g | PACK | Freq: Every day | ORAL | Status: DC | PRN

## 2018-01-23 MED ORDER — PHENYLEPHRINE HCL 10 MG/ML IJ SOLN
INTRAMUSCULAR | Status: DC | PRN
  Administered 2018-01-23 (×3): 80 ug via INTRAVENOUS

## 2018-01-23 MED ORDER — MEPERIDINE HCL 50 MG/ML IJ SOLN: 6.2500 mg | INTRAMUSCULAR | Status: DC | PRN

## 2018-01-23 MED ORDER — PROMETHAZINE HCL 25 MG/ML IJ SOLN: 6.2500 mg | INTRAMUSCULAR | Status: DC | PRN

## 2018-01-23 MED ORDER — LIDOCAINE 2% (20 MG/ML) 5 ML SYRINGE
INTRAMUSCULAR | Status: DC | PRN
  Administered 2018-01-23: 80 mg via INTRAVENOUS

## 2018-01-23 MED ORDER — SCOPOLAMINE 1 MG/3DAYS TD PT72
MEDICATED_PATCH | TRANSDERMAL | Status: AC
  Filled 2018-01-23: qty 1

## 2018-01-23 MED ORDER — LACTATED RINGERS IV SOLN
INTRAVENOUS | Status: DC
  Administered 2018-01-23: 16:00:00 via INTRAVENOUS

## 2018-01-23 MED ORDER — PROPOFOL 500 MG/50ML IV EMUL
INTRAVENOUS | Status: DC | PRN
  Administered 2018-01-23: 125 ug/kg/min via INTRAVENOUS
  Administered 2018-01-23: 10:00:00 via INTRAVENOUS

## 2018-01-23 MED ORDER — OXYCODONE HCL 5 MG PO TABS: 5.0000 mg | ORAL_TABLET | Freq: Once | ORAL | Status: DC | PRN

## 2018-01-23 MED ORDER — PROPOFOL 10 MG/ML IV BOLUS
INTRAVENOUS | Status: DC | PRN
  Administered 2018-01-23: 150 mg via INTRAVENOUS

## 2018-01-23 MED ORDER — DOCUSATE SODIUM 100 MG PO CAPS: 100.0000 mg | ORAL_CAPSULE | Freq: Two times a day (BID) | ORAL | Status: DC | PRN

## 2018-01-23 MED ORDER — BUPIVACAINE-EPINEPHRINE (PF) 0.25% -1:200000 IJ SOLN
INTRAMUSCULAR | Status: AC
  Filled 2018-01-23: qty 30

## 2018-01-23 MED ORDER — ROCURONIUM BROMIDE 10 MG/ML (PF) SYRINGE
PREFILLED_SYRINGE | INTRAVENOUS | Status: DC | PRN
  Administered 2018-01-23: 50 mg via INTRAVENOUS
  Administered 2018-01-23: 20 mg via INTRAVENOUS
  Administered 2018-01-23: 10 mg via INTRAVENOUS

## 2018-01-23 MED ORDER — CEFAZOLIN SODIUM-DEXTROSE 2-4 GM/100ML-% IV SOLN
2.0000 g | Freq: Three times a day (TID) | INTRAVENOUS | Status: AC
  Administered 2018-01-23 – 2018-01-24 (×2): 2 g via INTRAVENOUS
  Filled 2018-01-23 (×2): qty 100

## 2018-01-23 MED ORDER — METHOCARBAMOL 1000 MG/10ML IJ SOLN
500.0000 mg | Freq: Four times a day (QID) | INTRAVENOUS | Status: DC | PRN
  Filled 2018-01-23: qty 5

## 2018-01-23 MED ORDER — THROMBIN 20000 UNITS EX SOLR: CUTANEOUS | Status: DC | PRN

## 2018-01-23 MED ORDER — HYDROCODONE-ACETAMINOPHEN 10-325 MG PO TABS
2.0000 | ORAL_TABLET | ORAL | Status: DC | PRN
  Administered 2018-01-23 – 2018-01-24 (×4): 2 via ORAL
  Filled 2018-01-23 (×5): qty 2

## 2018-01-23 MED ORDER — MIDAZOLAM HCL 2 MG/2ML IJ SOLN
INTRAMUSCULAR | Status: DC | PRN
  Administered 2018-01-23: 2 mg via INTRAVENOUS

## 2018-01-23 MED ORDER — ACETAMINOPHEN 325 MG PO TABS: 650.0000 mg | ORAL_TABLET | ORAL | Status: DC | PRN

## 2018-01-23 MED ORDER — FENTANYL CITRATE (PF) 250 MCG/5ML IJ SOLN
INTRAMUSCULAR | Status: AC
  Filled 2018-01-23: qty 5

## 2018-01-23 MED ORDER — ONDANSETRON 4 MG PO TBDP: 4.0000 mg | ORAL_TABLET | Freq: Three times a day (TID) | ORAL | 0 refills | Status: DC | PRN

## 2018-01-23 MED ORDER — SODIUM CHLORIDE 0.9% FLUSH
3.0000 mL | INTRAVENOUS | Status: DC | PRN
Start: 2018-01-23 — End: 2018-01-24

## 2018-01-23 MED ORDER — ATORVASTATIN CALCIUM 20 MG PO TABS
40.0000 mg | ORAL_TABLET | Freq: Every day | ORAL | Status: DC
  Administered 2018-01-23: 40 mg via ORAL
  Filled 2018-01-23: qty 2

## 2018-01-23 MED ORDER — HYDROCODONE-ACETAMINOPHEN 5-325 MG PO TABS: 1.0000 | ORAL_TABLET | ORAL | 0 refills | Status: AC | PRN

## 2018-01-23 MED ORDER — LISINOPRIL 10 MG PO TABS
10.0000 mg | ORAL_TABLET | Freq: Every day | ORAL | Status: DC
  Administered 2018-01-23 – 2018-01-24 (×2): 10 mg via ORAL
  Filled 2018-01-23 (×2): qty 1

## 2018-01-23 MED ORDER — DEXAMETHASONE SODIUM PHOSPHATE 10 MG/ML IJ SOLN
INTRAMUSCULAR | Status: DC | PRN
  Administered 2018-01-23: 5 mg via INTRAVENOUS

## 2018-01-23 MED ORDER — OXYCODONE HCL 5 MG/5ML PO SOLN: 5.0000 mg | Freq: Once | ORAL | Status: DC | PRN

## 2018-01-23 MED ORDER — SUGAMMADEX SODIUM 200 MG/2ML IV SOLN
INTRAVENOUS | Status: DC | PRN
  Administered 2018-01-23 (×2): 200 mg via INTRAVENOUS

## 2018-01-23 MED ORDER — THROMBIN 20000 UNITS EX SOLR
CUTANEOUS | Status: DC | PRN
  Administered 2018-01-23: 20 mL via TOPICAL

## 2018-01-23 MED ORDER — MAGNESIUM CITRATE PO SOLN: 1.0000 | Freq: Once | ORAL | Status: DC | PRN

## 2018-01-23 MED ORDER — PHENOL 1.4 % MT LIQD: 1.0000 | OROMUCOSAL | Status: DC | PRN

## 2018-01-23 MED ORDER — MIDAZOLAM HCL 2 MG/2ML IJ SOLN
INTRAMUSCULAR | Status: AC
  Filled 2018-01-23: qty 2

## 2018-01-23 MED ORDER — BUPIVACAINE-EPINEPHRINE 0.25% -1:200000 IJ SOLN
INTRAMUSCULAR | Status: DC | PRN
  Administered 2018-01-23: 20 mL

## 2018-01-23 MED ORDER — SCOPOLAMINE 1 MG/3DAYS TD PT72
MEDICATED_PATCH | TRANSDERMAL | Status: DC | PRN
  Administered 2018-01-23: 1 via TRANSDERMAL

## 2018-01-23 MED ORDER — HYDROMORPHONE HCL 1 MG/ML IJ SOLN
INTRAMUSCULAR | Status: AC
  Filled 2018-01-23: qty 1

## 2018-01-23 MED ORDER — HYDROMORPHONE HCL 1 MG/ML IJ SOLN
0.2500 mg | INTRAMUSCULAR | Status: DC | PRN
  Administered 2018-01-23 (×2): 0.5 mg via INTRAVENOUS

## 2018-01-23 MED ORDER — CEFAZOLIN SODIUM-DEXTROSE 2-4 GM/100ML-% IV SOLN
2.0000 g | INTRAVENOUS | Status: AC
  Administered 2018-01-23: 2 g via INTRAVENOUS
  Filled 2018-01-23: qty 100

## 2018-01-23 MED ORDER — HYDROCODONE-ACETAMINOPHEN 10-325 MG PO TABS
ORAL_TABLET | ORAL | Status: AC
  Administered 2018-01-23: 1
  Filled 2018-01-23: qty 1

## 2018-01-23 MED ORDER — MENTHOL 3 MG MT LOZG
1.0000 | LOZENGE | OROMUCOSAL | Status: DC | PRN
Start: 2018-01-23 — End: 2018-01-24

## 2018-01-23 MED ORDER — FLUOXETINE HCL 20 MG PO CAPS
40.0000 mg | ORAL_CAPSULE | Freq: Every day | ORAL | Status: DC
  Administered 2018-01-24: 40 mg via ORAL
  Filled 2018-01-23: qty 2

## 2018-01-23 MED ORDER — THROMBIN 20000 UNITS EX SOLR
CUTANEOUS | Status: AC
  Filled 2018-01-23: qty 20000

## 2018-01-23 MED ORDER — ONDANSETRON HCL 4 MG/2ML IJ SOLN: 4.0000 mg | Freq: Four times a day (QID) | INTRAMUSCULAR | Status: DC | PRN

## 2018-01-23 MED ORDER — SODIUM CHLORIDE 0.9 % IV SOLN: 250.0000 mL | INTRAVENOUS | Status: DC

## 2018-01-23 MED ORDER — ONDANSETRON HCL 4 MG PO TABS: 4.0000 mg | ORAL_TABLET | Freq: Four times a day (QID) | ORAL | Status: DC | PRN

## 2018-01-23 MED ORDER — OXYBUTYNIN CHLORIDE ER 10 MG PO TB24
10.0000 mg | ORAL_TABLET | Freq: Every day | ORAL | Status: DC
  Administered 2018-01-23: 10 mg via ORAL
  Filled 2018-01-23: qty 1

## 2018-01-23 MED ORDER — DICYCLOMINE HCL 20 MG PO TABS
20.0000 mg | ORAL_TABLET | Freq: Four times a day (QID) | ORAL | Status: DC | PRN
  Filled 2018-01-23: qty 1

## 2018-01-23 MED ORDER — SODIUM CHLORIDE 0.9% FLUSH
3.0000 mL | Freq: Two times a day (BID) | INTRAVENOUS | Status: DC
  Administered 2018-01-23 (×2): 3 mL via INTRAVENOUS

## 2018-01-23 MED ORDER — METHOCARBAMOL 500 MG PO TABS
500.0000 mg | ORAL_TABLET | Freq: Four times a day (QID) | ORAL | Status: DC | PRN
  Administered 2018-01-23 – 2018-01-24 (×4): 500 mg via ORAL
  Filled 2018-01-23 (×3): qty 1

## 2018-01-23 MED ORDER — PROPOFOL 10 MG/ML IV BOLUS
INTRAVENOUS | Status: AC
  Filled 2018-01-23: qty 20

## 2018-01-23 MED ORDER — FENTANYL CITRATE (PF) 250 MCG/5ML IJ SOLN
INTRAMUSCULAR | Status: DC | PRN
  Administered 2018-01-23 (×3): 50 ug via INTRAVENOUS
  Administered 2018-01-23: 100 ug via INTRAVENOUS

## 2018-01-23 MED ORDER — LACTATED RINGERS IV SOLN
INTRAVENOUS | Status: DC | PRN
  Administered 2018-01-23: 07:00:00 via INTRAVENOUS

## 2018-01-23 SURGICAL SUPPLY — 66 items
CANISTER SUCT 3000ML PPV (MISCELLANEOUS) ×6 IMPLANT
CLOSURE STERI-STRIP 1/2X4 (GAUZE/BANDAGES/DRESSINGS) ×2
CLOSURE WOUND 1/4X4 (GAUZE/BANDAGES/DRESSINGS) ×1
CLSR STERI-STRIP ANTIMIC 1/2X4 (GAUZE/BANDAGES/DRESSINGS) ×4 IMPLANT
CONT SPEC 4OZ CLIKSEAL STRL BL (MISCELLANEOUS) ×3 IMPLANT
COVER MAYO STAND STRL (DRAPES) ×3 IMPLANT
COVER PROBE W GEL 5X96 (DRAPES) ×3 IMPLANT
COVER SURGICAL LIGHT HANDLE (MISCELLANEOUS) IMPLANT
DRAPE C-ARM 42X72 X-RAY (DRAPES) ×3 IMPLANT
DRAPE INCISE IOBAN 66X45 STRL (DRAPES) ×3 IMPLANT
DRAPE INCISE IOBAN 85X60 (DRAPES) ×3 IMPLANT
DRAPE SURG 17X23 STRL (DRAPES) IMPLANT
DRAPE U-SHAPE 47X51 STRL (DRAPES) ×6 IMPLANT
DRSG OPSITE POSTOP 4X6 (GAUZE/BANDAGES/DRESSINGS) ×3 IMPLANT
DURAPREP 26ML APPLICATOR (WOUND CARE) ×3 IMPLANT
ELECT BLADE 4.0 EZ CLEAN MEGAD (MISCELLANEOUS) ×3
ELECT CAUTERY BLADE 6.4 (BLADE) ×3 IMPLANT
ELECT PENCIL ROCKER SW 15FT (MISCELLANEOUS) ×3 IMPLANT
ELECT REM PT RETURN 9FT ADLT (ELECTROSURGICAL) ×3
ELECTRODE BLDE 4.0 EZ CLN MEGD (MISCELLANEOUS) ×1 IMPLANT
ELECTRODE REM PT RTRN 9FT ADLT (ELECTROSURGICAL) ×1 IMPLANT
FLOSEAL 5ML (HEMOSTASIS) ×3 IMPLANT
GENERATOR ×3 IMPLANT
GENERATOR PULSE PROCLAIM 5ELIT (Neuro Prosthesis/Implant) ×1 IMPLANT
GLOVE BIO SURGEON STRL SZ7 (GLOVE) ×3 IMPLANT
GLOVE BIOGEL PI IND STRL 7.0 (GLOVE) ×1 IMPLANT
GLOVE BIOGEL PI IND STRL 8 (GLOVE) ×2 IMPLANT
GLOVE BIOGEL PI IND STRL 8.5 (GLOVE) ×1 IMPLANT
GLOVE BIOGEL PI INDICATOR 7.0 (GLOVE) ×2
GLOVE BIOGEL PI INDICATOR 8 (GLOVE) ×4
GLOVE BIOGEL PI INDICATOR 8.5 (GLOVE) ×2
GLOVE ECLIPSE 7.5 STRL STRAW (GLOVE) ×9 IMPLANT
GLOVE SS N UNI LF 8.5 STRL (GLOVE) ×3 IMPLANT
GOWN STRL REUS W/ TWL LRG LVL3 (GOWN DISPOSABLE) ×2 IMPLANT
GOWN STRL REUS W/TWL 2XL LVL3 (GOWN DISPOSABLE) ×6 IMPLANT
GOWN STRL REUS W/TWL LRG LVL3 (GOWN DISPOSABLE) ×4
KIT BASIN OR (CUSTOM PROCEDURE TRAY) ×3 IMPLANT
KIT TURNOVER KIT B (KITS) ×3 IMPLANT
LEAD LAMITRODE 60CM 8CH (Lead) ×3 IMPLANT
LEAD OCTRODE GEN 8CH 60CM (Lead) ×3 IMPLANT
NEEDLE 22X1 1/2 (OR ONLY) (NEEDLE) ×3 IMPLANT
NEEDLE SPNL 18GX3.5 QUINCKE PK (NEEDLE) ×3 IMPLANT
NS IRRIG 1000ML POUR BTL (IV SOLUTION) ×3 IMPLANT
PACK LAMINECTOMY ORTHO (CUSTOM PROCEDURE TRAY) ×3 IMPLANT
PACK UNIVERSAL I (CUSTOM PROCEDURE TRAY) ×3 IMPLANT
PAD ARMBOARD 7.5X6 YLW CONV (MISCELLANEOUS) ×15 IMPLANT
PULSE GENERATOR PROCLAIM 5ELIT (Neuro Prosthesis/Implant) ×3 IMPLANT
Proclaim Patient Controller ×3 IMPLANT
SPATULA SILICONE BRAIN 10MM (MISCELLANEOUS) ×3 IMPLANT
SPONGE LAP 4X18 RFD (DISPOSABLE) ×3 IMPLANT
SPONGE SURGIFOAM ABS GEL 100 (HEMOSTASIS) ×3 IMPLANT
STAPLER VISISTAT 35W (STAPLE) ×3 IMPLANT
STRIP CLOSURE SKIN 1/4X4 (GAUZE/BANDAGES/DRESSINGS) ×2 IMPLANT
SURGIFLO W/THROMBIN 8M KIT (HEMOSTASIS) ×3 IMPLANT
SUT BONE WAX W31G (SUTURE) ×3 IMPLANT
SUT ETHIBOND 2 OS 4 DA (SUTURE) ×3 IMPLANT
SUT MNCRL AB 3-0 PS2 18 (SUTURE) ×6 IMPLANT
SUT VIC AB 1 CT1 18XCR BRD 8 (SUTURE) ×2 IMPLANT
SUT VIC AB 1 CT1 8-18 (SUTURE) ×4
SUT VIC AB 2-0 CT1 18 (SUTURE) ×6 IMPLANT
SYR BULB IRRIGATION 50ML (SYRINGE) IMPLANT
SYR CONTROL 10ML LL (SYRINGE) IMPLANT
TOWEL OR 17X24 6PK STRL BLUE (TOWEL DISPOSABLE) ×3 IMPLANT
TOWEL OR 17X26 10 PK STRL BLUE (TOWEL DISPOSABLE) ×3 IMPLANT
WATER STERILE IRR 1000ML POUR (IV SOLUTION) ×3 IMPLANT
YANKAUER SUCT BULB TIP NO VENT (SUCTIONS) ×3 IMPLANT

## 2018-01-23 NOTE — Evaluation (Signed)
Physical Therapy Evaluation Patient Details Name: Tracie Frank MRN: [id number] DOB: 03-23-1951 Today's Date: 01/23/2018   History of Present Illness  Pt is a 67 y/o female s/p Lumbar spinal cord stimulator placement. PMH includes DM, HTN, Cushing's disease, R THA, L TKA.   Clinical Impression  Patient is s/p above surgery resulting in the deficits listed below (see PT Problem List). Pt limited secondary to pain. Mild unsteadiness noted during gait, and pt required min to min guard A. Reports sister will be able to assist at d/c. Patient will benefit from skilled PT to increase their independence and safety with mobility (while adhering to their precautions) to allow discharge to the venue listed below.     Follow Up Recommendations No PT follow up;Supervision for mobility/OOB    Equipment Recommendations  None recommended by PT    Recommendations for Other Services       Precautions / Restrictions Precautions Precautions: Back Precaution Booklet Issued: Yes (comment) Precaution Comments: Reviewed back precautions with pt.  Restrictions Weight Bearing Restrictions: No      Mobility  Bed Mobility Overal bed mobility: Needs Assistance Bed Mobility: Rolling;Sidelying to Sit;Sit to Sidelying Rolling: Supervision Sidelying to sit: Supervision     Sit to sidelying: Supervision General bed mobility comments: supervision to ensure log roll technique. Verbal cues for sequencing. Increased time required   Transfers Overall transfer level: Needs assistance Equipment used: None Transfers: Sit to/from Stand Sit to Stand: Min assist         General transfer comment: Min A for lift assist and steadying. Increased time required.   Ambulation/Gait Ambulation/Gait assistance: Min assist;Min guard Gait Distance (Feet): 75 Feet Assistive device: None Gait Pattern/deviations: Step-through pattern;Decreased stride length Gait velocity: Decreased    General Gait Details: Slow,  slightly unsteady gait. Waddle type gait. Required min to min guard A for mobility. Educated about use of RW at home to increase stability.   Stairs            Wheelchair Mobility    Modified Rankin (Stroke Patients Only)       Balance Overall balance assessment: Needs assistance Sitting-balance support: No upper extremity supported;Feet supported Sitting balance-Leahy Scale: Good     Standing balance support: No upper extremity supported;During functional activity Standing balance-Leahy Scale: Fair                               Pertinent Vitals/Pain Pain Assessment: Faces Faces Pain Scale: Hurts even more Pain Location: back  Pain Descriptors / Indicators: Aching;Operative site guarding Pain Intervention(s): Limited activity within patient's tolerance;Monitored during session;Repositioned    Home Living Family/patient expects to be discharged to:: Private residence Living Arrangements: Alone Available Help at Discharge: Family;Available 24 hours/day(sister will be staying with her. ) Type of Home: House Home Access: Stairs to enter Entrance Stairs-Rails: Left Entrance Stairs-Number of Steps: 4 Home Layout: One level Home Equipment: Walker - 2 wheels;Cane - single point;Shower seat;Bedside commode      Prior Function Level of Independence: Independent with assistive device(s)         Comments: Used cane vs RW depending on the day.      Hand Dominance        Extremity/Trunk Assessment   Upper Extremity Assessment Upper Extremity Assessment: Defer to OT evaluation    Lower Extremity Assessment Lower Extremity Assessment: Generalized weakness    Cervical / Trunk Assessment Cervical / Trunk Assessment: Other exceptions Cervical / Trunk  Exceptions: s/p Lumbar spinal cord stimulator   Communication   Communication: No difficulties  Cognition Arousal/Alertness: Awake/alert Behavior During Therapy: WFL for tasks  assessed/performed Overall Cognitive Status: Within Functional Limits for tasks assessed                                        General Comments      Exercises     Assessment/Plan    PT Assessment Patient needs continued PT services  PT Problem List Decreased strength;Decreased balance;Decreased activity tolerance;Decreased mobility;Decreased knowledge of use of DME;Decreased knowledge of precautions;Pain       PT Treatment Interventions Gait training;DME instruction;Stair training;Functional mobility training;Therapeutic activities;Therapeutic exercise;Balance training;Patient/family education    PT Goals (Current goals can be found in the Care Plan section)  Acute Rehab PT Goals Patient Stated Goal: to decrease pain  PT Goal Formulation: With patient Time For Goal Achievement: 02/06/18 Potential to Achieve Goals: Good    Frequency Min 5X/week   Barriers to discharge        Co-evaluation               AM-PAC PT "6 Clicks" Daily Activity  Outcome Measure Difficulty turning over in bed (including adjusting bedclothes, sheets and blankets)?: A Little Difficulty moving from lying on back to sitting on the side of the bed? : A Little Difficulty sitting down on and standing up from a chair with arms (e.g., wheelchair, bedside commode, etc,.)?: Unable Help needed moving to and from a bed to chair (including a wheelchair)?: A Little Help needed walking in hospital room?: A Little Help needed climbing 3-5 steps with a railing? : A Lot 6 Click Score: 15    End of Session Equipment Utilized During Treatment: Gait belt Activity Tolerance: Patient limited by pain Patient left: in bed;with call bell/phone within reach Nurse Communication: Mobility status PT Visit Diagnosis: Other abnormalities of gait and mobility (R26.89);Unsteadiness on feet (R26.81);Pain Pain - part of body: (back)    Time: 4665-9935 PT Time Calculation (min) (ACUTE ONLY): 14  min   Charges:   PT Evaluation $PT Eval Low Complexity: 1 Low     PT G Codes:        Leighton Ruff, PT, DPT  Acute Rehabilitation Services  Pager: [phone]   Rudean Hitt 01/23/2018, 5:53 PM

## 2018-01-23 NOTE — Anesthesia Postprocedure Evaluation (Signed)
Anesthesia Post Note  Patient: Tracie Frank  Procedure(s) Performed: LUMBAR SPINAL CORD STIMULATOR INSERTION (N/A Spine Lumbar)     Patient location during evaluation: PACU Anesthesia Type: General Level of consciousness: awake and alert Pain management: pain level controlled Vital Signs Assessment: post-procedure vital signs reviewed and stable Respiratory status: spontaneous breathing, nonlabored ventilation and respiratory function stable Cardiovascular status: blood pressure returned to baseline and stable Postop Assessment: no apparent nausea or vomiting Anesthetic complications: no    Last Vitals:  Vitals:   01/23/18 1230 01/23/18 1248  BP:  (!) 147/88  Pulse: 72 75  Resp: 16 18  Temp:  36.6 C  SpO2: 93% 100%    Last Pain:  Vitals:   01/23/18 1248  TempSrc: Oral  PainSc:                  [name]

## 2018-01-23 NOTE — Evaluation (Signed)
Occupational Therapy Evaluation Patient Details Name: Tracie Frank MRN: [id number] DOB: 09/16/1950 Today's Date: 01/23/2018    History of Present Illness Pt is a 67 y/o female s/p Lumbar spinal cord stimulator placement. PMH includes DM, HTN, Cushing's disease, R THA, L TKA.    Clinical Impression   PTA, pt was living alone and was independent. Pt's sister will be staying at dc. Currently, pt requires supervision Guard A for ADLs with AE and functional mobility. Provided education and handout on back precautions, bed mobility, LB ADLs, toileting, and tub transfer with shower seat; pt demonstrated understanding. Answered all pt questions. Recommend dc home once medically stable per physician. All acute OT needs met and will sign off. Thank you.     Follow Up Recommendations  No OT follow up;Supervision/Assistance - 24 hour    Equipment Recommendations  None recommended by OT    Recommendations for Other Services PT consult     Precautions / Restrictions Precautions Precautions: Back Precaution Booklet Issued: Yes (comment) Precaution Comments: Reviewed back precautions with pt.  Restrictions Weight Bearing Restrictions: No      Mobility Bed Mobility Overal bed mobility: Needs Assistance Bed Mobility: Rolling;Sidelying to Sit;Sit to Sidelying Rolling: Supervision Sidelying to sit: Supervision     Sit to sidelying: Supervision General bed mobility comments: supervision to ensure log roll technique. Verbal cues for sequencing. Increased time required   Transfers Overall transfer level: Needs assistance Equipment used: None Transfers: Sit to/from Stand Sit to Stand: Min guard         General transfer comment: Min Guard for safety    Balance Overall balance assessment: Needs assistance Sitting-balance support: No upper extremity supported;Feet supported Sitting balance-Leahy Scale: Good     Standing balance support: No upper extremity supported;During  functional activity Standing balance-Leahy Scale: Fair                             ADL either performed or assessed with clinical judgement   ADL Overall ADL's : Needs assistance/impaired Eating/Feeding: Set up;Sitting   Grooming: Set up;Supervision/safety;Standing Grooming Details (indicate cue type and reason): Educating pt on compensatory techniques for oral care Upper Body Bathing: Set up;Supervision/ safety;Sitting   Lower Body Bathing: Min guard;Sit to/from stand;With adaptive equipment   Upper Body Dressing : Set up;Supervision/safety;Sitting   Lower Body Dressing: Sit to/from stand;With adaptive equipment;Min guard   Toilet Transfer: Min guard;Ambulation(Simulated in room)     Toileting - Clothing Manipulation Details (indicate cue type and reason): Educating pt on compensatory techniques for toielt hygiene   Tub/Shower Transfer Details (indicate cue type and reason): Educating pt on safe tub transfer. Pt verbalized understanding Functional mobility during ADLs: Min guard General ADL Comments: Providing education on back precautions, bed mobility, LB ADLs, toileting, and tub transfer. Pt verbalized and demonstrated understanding     Vision Baseline Vision/History: Wears glasses Patient Visual Report: No change from baseline       Perception     Praxis      Pertinent Vitals/Pain Pain Assessment: Faces Faces Pain Scale: Hurts even more Pain Location: back  Pain Descriptors / Indicators: Aching;Operative site guarding Pain Intervention(s): Monitored during session;Limited activity within patient's tolerance;Repositioned     Hand Dominance Right   Extremity/Trunk Assessment Upper Extremity Assessment Upper Extremity Assessment: Overall WFL for tasks assessed   Lower Extremity Assessment Lower Extremity Assessment: Defer to PT evaluation   Cervical / Trunk Assessment Cervical / Trunk Assessment: Other exceptions Cervical /  Trunk Exceptions: s/p  Lumbar spinal cord stimulator    Communication Communication Communication: No difficulties   Cognition Arousal/Alertness: Awake/alert Behavior During Therapy: WFL for tasks assessed/performed Overall Cognitive Status: Within Functional Limits for tasks assessed                                     General Comments       Exercises     Shoulder Instructions      Home Living Family/patient expects to be discharged to:: Private residence Living Arrangements: Alone Available Help at Discharge: Family;Available 24 hours/day(sister will be staying with her. ) Type of Home: House Home Access: Stairs to enter [employer] of Steps: 4 Entrance Stairs-Rails: Left Home Layout: One level     Bathroom Shower/Tub: Teacher, early years/pre: Standard     Home Equipment: Environmental consultant - 2 wheels;Cane - single point;Shower seat;Bedside commode          Prior Functioning/Environment Level of Independence: Independent with assistive device(s)        Comments: Used cane vs RW depending on the day.         OT Problem List: Decreased activity tolerance;Impaired balance (sitting and/or standing);Decreased range of motion;Decreased knowledge of use of DME or AE;Decreased knowledge of precautions;Pain      OT Treatment/Interventions:      OT Goals(Current goals can be found in the care plan section) Acute Rehab OT Goals Patient Stated Goal: to decrease pain  OT Goal Formulation: With patient Time For Goal Achievement: 02/06/18 Potential to Achieve Goals: Good  OT Frequency:     Barriers to D/C:            Co-evaluation              AM-PAC PT "6 Clicks" Daily Activity     Outcome Measure Help from another person eating meals?: None Help from another person taking care of personal grooming?: None Help from another person toileting, which includes using toliet, bedpan, or urinal?: A Little Help from another person bathing (including washing,  rinsing, drying)?: A Little Help from another person to put on and taking off regular upper body clothing?: None Help from another person to put on and taking off regular lower body clothing?: A Little 6 Click Score: 21   End of Session Nurse Communication: Mobility status;Precautions  Activity Tolerance: Patient tolerated treatment well Patient left: in bed;with call bell/phone within reach  OT Visit Diagnosis: Unsteadiness on feet (R26.81);Other abnormalities of gait and mobility (R26.89);Muscle weakness (generalized) (M62.81);Pain Pain - part of body: (Back)                Time: 2440-1027 OT Time Calculation (min): 19 min Charges:  OT General Charges $OT Visit: 1 Visit OT Evaluation $OT Eval Moderate Complexity: 1 Mod G-Codes:     Taivon Haroon MSOT, OTR/L Acute Rehab Pager: [phone] Office: [place] 01/23/2018, 6:07 PM

## 2018-01-23 NOTE — Transfer of Care (Signed)
Immediate Anesthesia Transfer of Care Note  Patient: Tracie Frank  Procedure(s) Performed: LUMBAR SPINAL CORD STIMULATOR INSERTION (N/A Spine Lumbar)  Patient Location: PACU  Anesthesia Type:General  Level of Consciousness: awake, alert , oriented and patient cooperative  Airway & Oxygen Therapy: Patient Spontanous Breathing and Patient connected to face mask oxygen  Post-op Assessment: Report given to RN and Post -op Vital signs reviewed and stable  Post vital signs: Reviewed and stable  Last Vitals:  Vitals Value Taken Time  BP 117/36 01/23/2018 11:32 AM  Temp    Pulse 63 01/23/2018 11:35 AM  Resp 14 01/23/2018 11:35 AM  SpO2 97 % 01/23/2018 11:35 AM  Vitals shown include unvalidated device data.  Last Pain:  Vitals:   01/23/18 0647  PainSc: 5       Patients Stated Pain Goal: 4 (53/20/23 3435)  Complications: No apparent anesthesia complications

## 2018-01-23 NOTE — Discharge Instructions (Signed)
Lumbar Diskectomy, Care After °Refer to this sheet in the next few weeks. These instructions provide you with information about caring for yourself after your procedure. Your health care provider may also give you more specific instructions. Your treatment has been planned according to current medical practices, but problems sometimes occur. Call your health care provider if you have any problems or questions after your procedure. °What can I expect after the procedure? °After the procedure, it is common to have: °· Pain. °· Numbness. °· Weakness. ° °Follow these instructions at home: °Medicines °· Take medicines only as directed by your health care provider. °· If you were prescribed an antibiotic medicine, finish all of it even if you start to feel better. °Incision care °· There are many different ways to close and cover an incision, including stitches (sutures), skin glue, and adhesive strips. Follow your health care provider's instructions about: °? Incision care. °? Bandage (dressing) changes and removal. °? Incision closure removal. °· Check your incision area every day for signs of infection. If you cannot see your incision, have someone check it for you. Watch for: °? Redness, swelling, or pain. °? Fluid, blood, or pus. °Activity °· Avoid sitting for longer than 20 minutes at a time or as directed by your health care provider. °· Do not climb stairs more than once each day until your health care provider approves. °· Do not bend at your waist. To pick things up, bend your knees. °· Do not lift anything that is heavier than 10 lb (4.5 kg) or as directed by your health care provider. °· Do not drive a car until your health care provider approves. °· Ask your health care provider when you may return to your normal activities, such as playing sports and going back to work. °· Work with your physical therapist to learn safe movement and exercises to help healing. Do these exercises as directed. °· Take short  walks often. °General instructions °· Do not use any tobacco products, including cigarettes, chewing tobacco, or electronic cigarettes. If you need help quitting, ask your health care provider. °· Follow your health care provider’s instructions about bathing. Do not take baths, shower, swim, or use a hot tub until your health care provider approves. °· Wear your back brace as directed by your health care provider. °· To prevent constipation: °? Drink enough fluid to keep your urine clear or pale yellow. °? Eat plenty of fruits, vegetables, and whole grains. °· Keep all follow-up visits as directed by your health care provider. This is important. This includes any follow-up visits with your physical therapist. °Contact a health care provider if: °· You have a fever. °· You have redness, swelling, or pain in your incision area. °· Your pain is not controlled with medicine. °· You have pain, numbness, or weakness that lasts longer than three weeks after surgery. °· You become constipated. °Get help right away if: °· You have fluid, blood, or pus coming from your incision. °· You have increasing pain, numbness, or weakness. °· You lose control of when you urinate or have a bowel movement (incontinence). °· You have chest pain. °· You have trouble breathing. °This information is not intended to replace advice given to you by your health care provider. Make sure you discuss any questions you have with your health care provider. °Document Released: 06/28/2004 Document Revised: 12/30/2015 Document Reviewed: 03/18/2014 °[employer] Interactive Patient Education © 2018 [employer] ° °

## 2018-01-23 NOTE — Op Note (Signed)
Operative report  Preoperative diagnosis: Chronic pain syndrome.  Failed back syndrome.  Postoperative diagnosis: Same  Operative procedure: Spinal cord stimulator implantation for chronic pain  Implants: [employer] spine: S8 lamitrode paddle.  Proclaim Elite 5 battery  First assistant: Ronette Deter, [place]  Indications: This is a very pleasant active 67 year old woman whose had chronic debilitating back and predominant left-sided lower extremity pain.  Despite appropriate conservative management her overall quality of life is continued to deteriorate.  As result she ultimately had a spinal cord stimulator trial and noted significant temporary improvement.  As result she presented today for definitive implantation.  All appropriate risks benefits and alternatives to surgery were discussed with the patient and consent was obtained.  Operative report.  Patient brought the operating placed by the operating room table.  After successful induction of general anesthesia and endotracheal intubation teds SCDs and it were applied and she was turned prone onto the Wilson frame.  All bony prominences well-padded and the back was prepped and draped in a standard fashion.  Intraoperative timeout was taken to confirm patient procedure and all other important data.  Patient was maximally being programmed at the T9 vertebral body so the decision was made to do a T10 laminotomy.  I marked out the T10 pedicle and marked my incision to span from the inferior aspect of the T10 spinous process to the superior aspect of the T12 spinous process.  I infiltrated the incision site with quarter percent Marcaine and then made my longitudinal incision.  Sharp dissection was carried out down to the deep fascia.  Deep fascia was sharply incised and I stripped the paraspinal muscles to expose the spinous process and lamina of T10, T11, and a portion of that of T12.  Self-retaining retractor was then placed to retract and completely exposed  the posterior aspect of the spine.  Fluoroscopy was again used to identify and confirmed the T10 lamina.  Once I confirmed as at the appropriate level I used a double-action Printmaker to remove the majority of the T10 spinous process.  A 2 and 3 mm Kerrison punch was used to perform a laminotomy of T10.  Penfield 4 was used to dissect through the thickened ligamentum flavum until I could develop a plane between the epidural fat and the ligamentum flavum.  I then used a 2 mm Kerrison punch to excise the ligamentum flavum and exposed the dorsal surface of the thecal sac.  I then attempted to place the tral tri-pole paddle this paddle met with too much resistance.  I then elected to use the S8 paddle.  This was obtained and gently advanced under the T10 lamina along the left side of midline.  Eventually the tip came to rest at the T8 vertebral body.  I confirmed with the spinal cord stimulator representative that I was spanning the appropriate area that correlated with where maximum pain relief was obtained during the trial.  Once this was confirmed I did try to attempt to place a percutaneously just to the right of midline for additional coverage if required.  However this was difficult and the percutaneous lead tended to drift to far lateral and so I abandoned this option.  I secured the lead directly to the spinous process of T11 using a #1 Ethibond suture.  I then wrapped the lead around the T11 spinous process for additional security.  A second incision was made over the right gluteal region and I dissected down and created a pocket proximately 2-1/2 cm  deep.  I then used the submuscular passer to advance the lead from the thoracic wound down to the gluteal wound.  I then secured it directly to the battery and tested to ensure that it was properly functioning.  The excess lead was wrapped behind the bottom of the battery and the battery was placed into the pocket.  I then secured the battery to the deep  fascia using two #1 Vicryl sutures.  At this point final x-rays were taken which demonstrated satisfactory positioning of the paddle in both the AP and lateral planes.  At this point I irrigated both wounds copiously with normal saline and closed in a layered fashion with interrupted #1 Vicryl suture, 2-0 Vicryl suture, and a 3-0 Monocryl.  Steri-Strips and a dry dressing were applied.  Patient was ultimately extubated transfer the PACU without incident.  The end of the case all needle sponge counts were correct.

## 2018-01-23 NOTE — Brief Op Note (Signed)
01/23/2018  10:54 AM  PATIENT:  Tracie Frank  67 y.o. female  PRE-OPERATIVE DIAGNOSIS:  Chronic pain syndrome  POST-OPERATIVE DIAGNOSIS:  Chronic pain syndrome  PROCEDURE:  Procedure(s) with comments: LUMBAR SPINAL CORD STIMULATOR INSERTION (N/A) - 3 hrs  SURGEON:  Surgeon(s) and Role:    Melina Schools, MD - Primary  PHYSICIAN ASSISTANT:   ASSISTANTS: Carmen Mayo, PA   ANESTHESIA:   general  EBL:  40 mL   BLOOD ADMINISTERED:none  DRAINS: none   LOCAL MEDICATIONS USED:  MARCAINE     SPECIMEN:  No Specimen  DISPOSITION OF SPECIMEN:  N/A  COUNTS:  YES  TOURNIQUET:  * No tourniquets in log *  DICTATION: .Dragon Dictation  PLAN OF CARE: Admit for overnight observation  PATIENT DISPOSITION:  PACU - hemodynamically stable.

## 2018-01-23 NOTE — Anesthesia Preprocedure Evaluation (Signed)
Anesthesia Evaluation  Patient identified by MRN, date of birth, ID band Patient awake    Reviewed: Allergy & Precautions, H&P , NPO status , Patient's Chart, lab work & pertinent test results  History of Anesthesia Complications (+) PONV and history of anesthetic complications  Airway Mallampati: II  TM Distance: >3 FB Neck ROM: full    Dental no notable dental hx.    Pulmonary neg pulmonary ROS, sleep apnea , former smoker,    Pulmonary exam normal breath sounds clear to auscultation       Cardiovascular Exercise Tolerance: Good hypertension, Pt. on medications negative cardio ROS Normal cardiovascular exam Rhythm:regular Rate:Normal     Neuro/Psych  Headaches, Anxiety Depression negative neurological ROS  negative psych ROS   GI/Hepatic negative GI ROS, Neg liver ROS,   Endo/Other  negative endocrine ROSdiabetesMorbid obesity  Renal/GU negative Renal ROS  negative genitourinary   Musculoskeletal negative musculoskeletal ROS (+)   Abdominal   Peds negative pediatric ROS (+)  Hematology negative hematology ROS (+)   Anesthesia Other Findings Cushing's disease (HCC)    Hyperlipidemia     Anxiety    Arthritis     Depression    Hypertension....... is currently not having to take medications       Pneumonia     Diabetes mellitus 10 years ago... is currently not taking any medications for DM       Reproductive/Obstetrics negative OB ROS                             Anesthesia Physical  Anesthesia Plan  ASA: III  Anesthesia Plan: General   Post-op Pain Management:    Induction: Intravenous  PONV Risk Score and Plan: 4 or greater and Treatment may vary due to age or medical condition, Ondansetron, Dexamethasone, Midazolam and Scopolamine patch - Pre-op  Airway Management Planned: Oral ETT  Additional Equipment:   Intra-op Plan:   Post-operative Plan: Extubation  in OR  Informed Consent: I have reviewed the patients History and Physical, chart, labs and discussed the procedure including the risks, benefits and alternatives for the proposed anesthesia with the patient or authorized representative who has indicated his/her understanding and acceptance.   Dental Advisory Given  Plan Discussed with: CRNA  Anesthesia Plan Comments:         Anesthesia Quick Evaluation

## 2018-01-23 NOTE — Anesthesia Procedure Notes (Addendum)
Procedure Name: Intubation Date/Time: 01/23/2018 8:41 AM Performed by: White, Amedeo Plenty, CRNA Pre-anesthesia Checklist: Patient identified, Emergency Drugs available, Suction available and Patient being monitored Patient Re-evaluated:Patient Re-evaluated prior to induction Oxygen Delivery Method: Circle System Utilized Preoxygenation: Pre-oxygenation with 100% oxygen Induction Type: IV induction Ventilation: Mask ventilation without difficulty and Oral airway inserted - appropriate to patient size Laryngoscope Size: Mac and 3 Grade View: Grade I Tube type: Oral Number of attempts: 1 Airway Equipment and Method: Stylet and Oral airway Placement Confirmation: ETT inserted through vocal cords under direct vision,  positive ETCO2 and breath sounds checked- equal and bilateral Secured at: 21 cm Tube secured with: Tape Dental Injury: Teeth and Oropharynx as per pre-operative assessment  Comments: Planned TIVA due to patient's reported history of PONV.

## 2018-01-24 ENCOUNTER — Other Ambulatory Visit: Payer: Self-pay

## 2018-01-24 ENCOUNTER — Encounter (HOSPITAL_COMMUNITY): Payer: Self-pay | Admitting: Orthopedic Surgery

## 2018-01-24 DIAGNOSIS — G894 Chronic pain syndrome: Secondary | ICD-10-CM | POA: Diagnosis not present

## 2018-01-24 LAB — GLUCOSE, CAPILLARY: Glucose-Capillary: 130 mg/dL — ABNORMAL HIGH (ref 65–99)

## 2018-01-24 MED ORDER — LACTATED RINGERS IV SOLN
INTRAVENOUS | Status: DC
  Administered 2018-01-24: 01:00:00 via INTRAVENOUS

## 2018-01-24 NOTE — Discharge Summary (Signed)
Physician Discharge Summary  Patient ID: Tracie Frank MRN: [id number] DOB/AGE: Dec 24, 1950 67 y.o.  Admit date: 01/23/2018 Discharge date: 01/24/2018  Admission Diagnoses:  Chronic pain syndrome  Discharge Diagnoses:  Active Problems:   S/P insertion of spinal cord stimulator   Past Medical History:  Diagnosis Date  . Allergy   . Anxiety   . Arthritis   . Cushing's disease ([place])   . Depression   . Diabetes mellitus 10 years ago   pt states is currently not taking any medications for DM  . Headache   . History of frequent urinary tract infections   . Hyperlipidemia   . Hypertension    pt states was on BP meds in past but is currently not having to take medications   . Motion sickness   . Peripheral vascular disease (HCC)    right leg sig. and left leg moderate  . Pneumonia   . PONV (postoperative nausea and vomiting)   . Sleep apnea   . Wears glasses     Surgeries: Procedure(s): LUMBAR SPINAL CORD STIMULATOR INSERTION on 01/23/2018   Consultants (if any):   Discharged Condition: Improved  Hospital Course: Tracie Frank is an 67 y.o. female who was admitted 01/23/2018 with a diagnosis of Chronic Pain syndrome and went to the operating room on 01/23/2018 and underwent the above named procedures.  Post op day one pt reports a low level of pain.  Pt is urinating and ambulating w/o difficulty.  Pt is cleared by PT for DC.  Pt met with [employer] rep prior to DC.   She was given perioperative antibiotics:  Anti-infectives (From admission, onward)   Start     Dose/Rate Route Frequency Ordered Stop   01/23/18 1700  ceFAZolin (ANCEF) IVPB 2g/100 mL premix     2 g 200 mL/hr over 30 Minutes Intravenous Every 8 hours 01/23/18 1252 01/24/18 1020   01/23/18 0542  ceFAZolin (ANCEF) IVPB 2g/100 mL premix     2 g 200 mL/hr over 30 Minutes Intravenous 30 min pre-op 01/23/18 0542 01/23/18 0856    .  She was given sequential compression devices, early ambulation, and TED for DVT  prophylaxis.  She benefited maximally from the hospital stay and there were no complications.    Recent vital signs:  Vitals:   01/23/18 2320 01/24/18 0717  BP: 97/63 (!) 115/97  Pulse: 61 82  Resp: 20 16  Temp: 98.6 F (37 C) 98 F (36.7 C)  SpO2: 93% 94%    Recent laboratory studies:  Lab Results  Component Value Date   HGB 15.2 (H) 01/15/2018   HGB 11.1 (L) 01/12/2016   HGB 13.9 01/04/2016   Lab Results  Component Value Date   WBC 9.7 01/15/2018   PLT 331 01/15/2018   No results found for: INR Lab Results  Component Value Date   NA 141 01/15/2018   K 4.0 01/15/2018   CL 108 01/15/2018   CO2 24 01/15/2018   BUN 19 01/15/2018   CREATININE 0.91 01/15/2018   GLUCOSE 121 (H) 01/15/2018    Discharge Medications:   Allergies as of 01/24/2018      Reactions   Gabapentin Other (See Comments)   Other reaction(s): Other Twitching, loss of bladder control   Lyrica [pregabalin] Other (See Comments)   Jerking movements; confusion; incontinence   Morphine And Related Nausea And Vomiting   Other Nausea And Vomiting   anesthesia      Medication List    STOP taking these medications  aspirin EC 81 MG tablet   zolpidem 10 MG tablet Commonly known as:  AMBIEN     TAKE these medications   ALPRAZolam 0.25 MG tablet Commonly known as:  XANAX Take 0.25 mg by mouth at bedtime as needed for anxiety or sleep.   atorvastatin 40 MG tablet Commonly known as:  LIPITOR Take 40 mg by mouth at bedtime.   CALTRATE 600+D 600-400 MG-UNIT tablet Generic drug:  Calcium Carbonate-Vitamin D Take 1 tablet by mouth daily.   cetirizine 10 MG tablet Commonly known as:  ZYRTEC Take 10 mg by mouth 2 (two) times daily as needed for allergies.   dicyclomine 20 MG tablet Commonly known as:  BENTYL Take 20 mg by mouth every 6 (six) hours as needed (for IBS).   docusate sodium 100 MG capsule Commonly known as:  COLACE Take 100 mg by mouth 2 (two) times daily as needed (for  constipation.).   FLUoxetine 40 MG capsule Commonly known as:  PROZAC Take 40 mg by mouth daily.   HEALTHY EYES PO Take 1 tablet by mouth daily.   HYDROcodone-acetaminophen 5-325 MG tablet Commonly known as:  NORCO/VICODIN Take 1 tablet by mouth every 4 (four) hours as needed for up to 5 days for moderate pain. What changed:  when to take this   ketotifen 0.025 % ophthalmic solution Commonly known as:  ZADITOR Place 1 drop into both eyes 2 (two) times daily as needed (for itchy eyes.).   L-Lysine 500 MG Tabs Take 500 mg by mouth daily as needed (for fever blister).   lisinopril 10 MG tablet Commonly known as:  PRINIVIL,ZESTRIL Take 10 mg by mouth daily.   ondansetron 4 MG disintegrating tablet Commonly known as:  ZOFRAN ODT Take 1 tablet (4 mg total) by mouth every 8 (eight) hours as needed.   oxybutynin 10 MG 24 hr tablet Commonly known as:  DITROPAN-XL Take 10 mg by mouth at bedtime.   vitamin C 500 MG tablet Commonly known as:  ASCORBIC ACID Take 500 mg by mouth daily.   Vitamin D3 2000 units Tabs Take 2,000 Units by mouth daily.       Diagnostic Studies: Dg Thoracic Spine 2 View  Result Date: 01/23/2018 CLINICAL DATA:  Spinal cord stimulator placement EXAM: THORACIC SPINE 2 VIEWS; DG C-ARM 61-120 MIN COMPARISON:  None FLUOROSCOPY TIME:  0 minutes 42 seconds Images obtained: 2 FINDINGS: Intraspinal stimulator identified at the dorsal aspect of the spinal canal from the inferior T8 level extending to the T10-T11 disc space level. Bones appear demineralized. IMPRESSION: Intraspinal stimulator placement as above. Electronically Signed   By: Lavonia Dana M.D.   On: 01/23/2018 10:53   Dg C-arm 1-60 Min  Result Date: 01/23/2018 CLINICAL DATA:  Spinal cord stimulator placement EXAM: THORACIC SPINE 2 VIEWS; DG C-ARM 61-120 MIN COMPARISON:  None FLUOROSCOPY TIME:  0 minutes 42 seconds Images obtained: 2 FINDINGS: Intraspinal stimulator identified at the dorsal aspect of the  spinal canal from the inferior T8 level extending to the T10-T11 disc space level. Bones appear demineralized. IMPRESSION: Intraspinal stimulator placement as above. Electronically Signed   By: Lavonia Dana M.D.   On: 01/23/2018 10:53   Dg C-arm 1-60 Min  Result Date: 01/23/2018 CLINICAL DATA:  Spinal cord stimulator placement EXAM: THORACIC SPINE 2 VIEWS; DG C-ARM 61-120 MIN COMPARISON:  None FLUOROSCOPY TIME:  0 minutes 42 seconds Images obtained: 2 FINDINGS: Intraspinal stimulator identified at the dorsal aspect of the spinal canal from the inferior T8 level extending  to the T10-T11 disc space level. Bones appear demineralized. IMPRESSION: Intraspinal stimulator placement as above. Electronically Signed   By: Lavonia Dana M.D.   On: 01/23/2018 10:53    Disposition: Post op meds provided  Pt will present to clinic in 2 weeks Discharge Instructions    Incentive spirometry RT   Complete by:  As directed       Follow-up Information    Melina Schools, MD Follow up in 2 week(s).   Specialty:  Orthopedic Surgery Contact information: [address] [place] [address] [phone]            Signed: Valinda Hoar 01/24/2018, 2:35 PM

## 2018-01-24 NOTE — Progress Notes (Signed)
Patient is discharged from room 3C04 at this time. Alert and in stable condition. IV site d/c'd and instructions read to patient and friend with understanding verbalized. Left unit via wheelchair with all belongings at side.

## 2018-01-24 NOTE — Progress Notes (Signed)
    Subjective: 1 Day Post-Op Procedure(s) (LRB): LUMBAR SPINAL CORD STIMULATOR INSERTION (N/A) Patient reports pain as 2 on 0-10 scale.   Denies CP or SOB.  Voiding without difficulty. Pt has chronic incontinence.  Positive flatus.  Ambulating in hallway.  Objective: Vital signs in last 24 hours: Temp:  [97.5 F (36.4 C)-98.6 F (37 C)] 98 F (36.7 C) (06/20 0717) Pulse Rate:  [61-88] 82 (06/20 0717) Resp:  [12-20] 16 (06/20 0717) BP: (96-147)/(36-97) 115/97 (06/20 0717) SpO2:  [85 %-100 %] 94 % (06/20 0717)  Intake/Output from previous day: 06/19 0701 - 06/20 0700 In: 1458.6 [P.O.:480; I.V.:900; IV Piggyback:78.6] Out: 40 [Blood:40] Intake/Output this shift: No intake/output data recorded.  Labs: No results for input(s): HGB in the last 72 hours. No results for input(s): WBC, RBC, HCT, PLT in the last 72 hours. No results for input(s): NA, K, CL, CO2, BUN, CREATININE, GLUCOSE, CALCIUM in the last 72 hours. No results for input(s): LABPT, INR in the last 72 hours.  Physical Exam: Neurologically intact ABD soft Sensation intact distally Dorsiflexion/Plantar flexion intact Incision: no drainage Compartment soft There is no height or weight on file to calculate BMI.   Assessment/Plan: 1 Day Post-Op Procedure(s) (LRB): LUMBAR SPINAL CORD STIMULATOR INSERTION (N/A) Advance diet Up with therapy  DC home today Pt needs to meet with [employer] rep before  Fronnie Urton, Darla Lesches for Dr. Melina Schools [facility] [phone] 01/24/2018, 7:24 AM

## 2018-01-24 NOTE — Progress Notes (Signed)
Physical Therapy Treatment Patient Details Name: Tracie Frank MRN: [id number] DOB: 1951-05-06 Today's Date: 01/24/2018    History of Present Illness Pt is a 67 y/o female s/p Lumbar spinal cord stimulator placement. PMH includes DM, HTN, Cushing's disease, R THA, L TKA.     PT Comments    Pt progressing towards physical therapy goals. Was able to perform transfers and ambulation with gross min guard assist to supervision for safety. Pt was educated on precautions, safety with activity progression, and car transfer. Will continue to follow and progress as able per POC.    Follow Up Recommendations  No PT follow up;Supervision for mobility/OOB     Equipment Recommendations  None recommended by PT    Recommendations for Other Services       Precautions / Restrictions Precautions Precautions: Back Precaution Booklet Issued: Yes (comment) Precaution Comments: Reviewed back precautions with pt.  Restrictions Weight Bearing Restrictions: No    Mobility  Bed Mobility Overal bed mobility: Needs Assistance Bed Mobility: Rolling;Sidelying to Sit;Sit to Sidelying Rolling: Supervision Sidelying to sit: Supervision     Sit to sidelying: Supervision General bed mobility comments: supervision to ensure log roll technique, and several attempts required for proper maintenance of precautions. Verbal cues for sequencing. Increased time required   Transfers Overall transfer level: Needs assistance Equipment used: None Transfers: Sit to/from Stand Sit to Stand: Min guard         General transfer comment: VC's for hand placement on seated surface for safety. Pt was able to power-up to full stand without assistance.   Ambulation/Gait Ambulation/Gait assistance: Min guard Gait Distance (Feet): 350 Feet Assistive device: None Gait Pattern/deviations: Step-through pattern;Decreased stride length Gait velocity: Decreased  Gait velocity interpretation: 1.31 - 2.62 ft/sec, indicative  of limited community ambulator General Gait Details: Slow, slightly unsteady gait. Waddle type gait. Required min to min guard A for mobility. Educated about use of RW at home to increase stability.    Stairs Stairs: Yes Stairs assistance: Min guard Stair Management: One rail Left;Step to pattern;Forwards Number of Stairs: 4 General stair comments: VC's for sequencing and general safety.    Wheelchair Mobility    Modified Rankin (Stroke Patients Only)       Balance Overall balance assessment: Needs assistance Sitting-balance support: No upper extremity supported;Feet supported Sitting balance-Leahy Scale: Good     Standing balance support: No upper extremity supported;During functional activity Standing balance-Leahy Scale: Fair                              Cognition Arousal/Alertness: Awake/alert Behavior During Therapy: WFL for tasks assessed/performed Overall Cognitive Status: Within Functional Limits for tasks assessed                                        Exercises      General Comments        Pertinent Vitals/Pain Pain Assessment: Faces Faces Pain Scale: Hurts even more Pain Location: back  Pain Descriptors / Indicators: Aching;Operative site guarding Pain Intervention(s): Monitored during session    Home Living Family/patient expects to be discharged to:: Private residence Living Arrangements: Alone                  Prior Function            PT Goals (current goals can now be found in  the care plan section) Acute Rehab PT Goals Patient Stated Goal: to decrease pain  PT Goal Formulation: With patient Time For Goal Achievement: 02/06/18 Potential to Achieve Goals: Good Progress towards PT goals: Progressing toward goals    Frequency    Min 5X/week      PT Plan Current plan remains appropriate    Co-evaluation              AM-PAC PT "6 Clicks" Daily Activity  Outcome Measure  Difficulty  turning over in bed (including adjusting bedclothes, sheets and blankets)?: None Difficulty moving from lying on back to sitting on the side of the bed? : A Little Difficulty sitting down on and standing up from a chair with arms (e.g., wheelchair, bedside commode, etc,.)?: A Little Help needed moving to and from a bed to chair (including a wheelchair)?: A Little Help needed walking in hospital room?: A Little Help needed climbing 3-5 steps with a railing? : A Little 6 Click Score: 19    End of Session Equipment Utilized During Treatment: Gait belt Activity Tolerance: Patient limited by pain Patient left: in bed;with call bell/phone within reach Nurse Communication: Mobility status PT Visit Diagnosis: Other abnormalities of gait and mobility (R26.89);Unsteadiness on feet (R26.81);Pain Pain - part of body: (back)     Time: 0240-9735 PT Time Calculation (min) (ACUTE ONLY): 19 min  Charges:  $Gait Training: 8-22 mins                    G Codes:       Rolinda Roan, PT, DPT Acute Rehabilitation Services Pager: [phone]    Thelma Comp 01/24/2018, 11:39 AM

## 2018-04-29 ENCOUNTER — Ambulatory Visit: Payer: Self-pay | Admitting: Surgery

## 2018-04-29 NOTE — H&P (Signed)
Tracie Frank Documented: 04/29/2018 1:40 PM Location: [facility] Patient #: [id number] DOB: 11-23-1950 Divorced / Language: [name] / Race: White Female  History of Present Illness Marcello Moores A. Kawehi Hostetter MD; 04/29/2018 2:00 PM) Patient words: Patient status at the request of Dr. Prudy Feeler for a bulge in her left upper abdomen. She underwent a laparoscopic repair week for stent and Tracie Frank 7 years ago with mesh for a left upper quadrant hernia. I have no records of this and this is per patient report. She developed more swelling at the previous repair site in her left upper abdomen. It is made worse with coughing, straining or sneezing. Pain is mild to moderate intensity made worse with exertion described as dull achy to sharp without radiation. She denies any nausea or vomiting. She continues to smoke cigarettes and is overweight.  The patient is a 67 year old female.   Past Surgical History Tracie Frank, CMA; 04/29/2018 1:41 PM) Colon Polyp Removal - Colonoscopy Hip Surgery Right. Knee Surgery Left. Laparoscopic Inguinal Hernia Surgery Left. Spinal Surgery Midback  Diagnostic Studies History Tracie Lull R. Tracie Frank, CMA; 04/29/2018 1:41 PM) Colonoscopy 1-5 years ago Mammogram within last year Pap Smear >5 years ago  Allergies Tracie Frank, CMA; 04/29/2018 1:41 PM) Gabapentin *ANTICONVULSANTS* Codeine Phosphate *ANALGESICS - OPIOID* Morphine Sulfate *ANALGESICS - OPIOID*  Medication History (Michelle R. Frank, CMA; 04/29/2018 1:43 PM) Lisinopril (10MG  Tablet, Oral) Active. Atorvastatin Calcium (40MG  Tablet, Oral) Active. FLUoxetine HCl (40MG  Capsule, Oral) Active. ALPRAZolam (0.25MG  Tablet, Oral) Active. Dicyclomine HCl (20MG  Tablet, Oral) Active. Oxybutynin Chloride ER (10MG  Tablet ER 24HR, Oral) Active. Zolpidem Tartrate (10MG  Tablet, Oral) Active. HYDROcodone-Acetaminophen (5-325MG  Tablet, Oral) Active. Fluticasone  Propionate (50MCG/ACT Suspension, Nasal) Active. Vitamin D3 (Oral) Specific strength unknown - Active. Vitamin C (Oral) Specific strength unknown - Active. Aspirin (81MG  Tablet, Oral) Active. Medications Reconciled  Social History Tracie Frank, CMA; 04/29/2018 1:41 PM) Alcohol use Occasional alcohol use. Caffeine use Carbonated beverages, Coffee, Tea. No drug use Tobacco use Former smoker.  Family History Tracie Lull R. Tracie Frank, CMA; 04/29/2018 1:41 PM) Alcohol Abuse Son. Arthritis Brother. Bleeding disorder Brother. Colon Polyps Brother. Diabetes Mellitus Brother. Heart disease in female family member before age 103 Hypertension Brother, Sister. Melanoma Sister. Respiratory Condition Brother. Thyroid problems Sister.  Pregnancy / Birth History Tracie Lull R. Tracie Frank, CMA; 04/29/2018 1:41 PM) Age at menarche 22 years. Age of menopause 47-50 Contraceptive History Contraceptive implant, Oral contraceptives. Gravida 4 Maternal age 37-20 Para 2  Other Problems Tracie Frank, CMA; 04/29/2018 1:41 PM) Anxiety Disorder Arthritis Back Pain Bladder Problems Depression Diabetes Mellitus General anesthesia - complications High blood pressure Hypercholesterolemia Inguinal Hernia Sleep Apnea     Review of Systems [facility] R. Frank CMA; 04/29/2018 1:41 PM) General Present- Fatigue. Not Present- Appetite Loss, Chills, Fever, Night Sweats, Weight Gain and Weight Loss. Skin Present- Change in Wart/Mole and Dryness. Not Present- Hives, Jaundice, New Lesions, Non-Healing Wounds, Rash and Ulcer. HEENT Present- Seasonal Allergies, Sinus Pain and Wears glasses/contact lenses. Not Present- Earache, Hearing Loss, Hoarseness, Nose Bleed, Oral Ulcers, Ringing in the Ears, Sore Throat, Visual Disturbances and Yellow Eyes. Respiratory Present- Snoring. Not Present- Bloody sputum, Chronic Cough, Difficulty Breathing and Wheezing. Breast Not Present-  Breast Mass, Breast Pain, Nipple Discharge and Skin Changes. Cardiovascular Not Present- Chest Pain, Difficulty Breathing Lying Down, Leg Cramps, Palpitations, Rapid Heart Rate, Shortness of Breath and Swelling of Extremities. Gastrointestinal Present- Abdominal Pain, Change in Bowel Habits, Chronic diarrhea and Constipation. Not Present- Bloating, Bloody Stool,  Difficulty Swallowing, Excessive gas, Gets full quickly at meals, Hemorrhoids, Indigestion, Nausea, Rectal Pain and Vomiting. Female Genitourinary Present- Pelvic Pain. Not Present- Frequency, Nocturia, Painful Urination and Urgency. Musculoskeletal Present- Back Pain, Joint Pain and Joint Stiffness. Not Present- Muscle Pain, Muscle Weakness and Swelling of Extremities. Neurological Present- Decreased Memory, Headaches and Tingling. Not Present- Fainting, Numbness, Seizures, Tremor, Trouble walking and Weakness. Psychiatric Present- Anxiety and Depression. Not Present- Bipolar, Change in Sleep Pattern, Fearful and Frequent crying. Endocrine Present- Hair Changes and Heat Intolerance. Not Present- Cold Intolerance, Excessive Hunger, Hot flashes and New Diabetes. Hematology Present- Easy Bruising and Gland problems. Not Present- Blood Thinners, Excessive bleeding, HIV and Persistent Infections.  [employer] R. Frank CMA; 04/29/2018 1:41 PM) 04/29/2018 1:40 PM Weight: 240.38 lb Height: 62in Body Surface Area: 2.07 Frank Body Mass Index: 43.96 kg/Frank  BP: 122/82 (Sitting, Left Arm, Standard)      Physical Exam (Jayana Kotula A. Ellianah Cordy MD; 04/29/2018 2:01 PM)  General Mental Status-Alert. General Appearance-Consistent with stated age. Hydration-Well hydrated. Voice-Normal.  Head and Neck Head-normocephalic, atraumatic with no lesions or palpable masses. Trachea-midline. Thyroid Gland Characteristics - normal size and consistency.  Eye Eyeball - Bilateral-Extraocular movements intact. Sclera/Conjunctiva -  Bilateral-No scleral icterus.  Chest and Lung Exam Chest and lung exam reveals -quiet, even and easy respiratory effort with no use of accessory muscles and on auscultation, normal breath sounds, no adventitious sounds and normal vocal resonance. Inspection Chest Wall - Normal. Back - normal.  Cardiovascular Cardiovascular examination reveals -normal heart sounds, regular rate and rhythm with no murmurs and normal pedal pulses bilaterally.  Abdomen Note: 4 cm reducible mass left upper quadrant over your previous port site. Reducible. No tenderness.   Neurologic Neurologic evaluation reveals -alert and oriented x 3 with no impairment of recent or remote memory. Mental Status-Normal.  Musculoskeletal Normal Exam - Left-Upper Extremity Strength Normal and Lower Extremity Strength Normal. Normal Exam - Right-Upper Extremity Strength Normal and Lower Extremity Strength Normal.    Assessment & Plan (Tavari Loadholt A. 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 May benefit from open approach this time with mesh. Obesity and smoking history increase her risk of recurrence are threefold. The risk of hernia repair include bleeding, infection, organ injury, bowel injury, bladder injury, nerve injury recurrent hernia, blood clots, worsening of underlying condition, chronic pain, mesh use, open surgery, death, and the need for other operattions. Pt agrees to proceed  Current Plans Pt Education - Pamphlet Given - Hernia Surgery: discussed with patient and provided information. The anatomy & physiology of the abdominal wall was discussed. The pathophysiology of hernias was discussed. Natural history risks without surgery including progeressive enlargement, pain, incarceration, & strangulation was discussed. Contributors to complications such as smoking,  obesity, diabetes, prior surgery, etc were discussed.  I feel the risks of no intervention will lead to serious problems that outweigh the operative risks; therefore, I recommended surgery to reduce and repair the hernia. I explained laparoscopic techniques with open approach. I noted the probable use of mesh to patch and/or buttress the hernia repair  Risks such as bleeding, infection, abscess, need for further treatment, heart attack, death, and other risks were discussed. I noted a good likelihood this will help address the problem. Goals of post-operative recovery were discussed as well. Possibility that this will not correct all symptoms was explained. I stressed the importance of low-impact activity, aggressive pain control,  avoiding constipation, & not pushing through pain to minimize risk of post-operative chronic pain or injury. Possibility of reherniation especially with smoking, obesity, diabetes, immunosuppression, and other health conditions was discussed. We will work to minimize complications.  An educational handout further explaining the pathology & treatment options was given as well. Questions were answered. The patient expresses understanding & wishes to proceed with surgery.  Pt Education - [facility] Mesh education: discussed with patient and provided information.

## 2018-05-06 ENCOUNTER — Other Ambulatory Visit: Payer: Self-pay | Admitting: Surgery

## 2018-05-08 ENCOUNTER — Other Ambulatory Visit: Payer: Self-pay | Admitting: Surgery

## 2018-05-08 DIAGNOSIS — K432 Incisional hernia without obstruction or gangrene: Secondary | ICD-10-CM

## 2018-05-14 ENCOUNTER — Other Ambulatory Visit: Payer: Medicare Other

## 2018-05-21 ENCOUNTER — Ambulatory Visit
Admission: RE | Admit: 2018-05-21 | Discharge: 2018-05-21 | Disposition: A | Discharge status: Home / Self Care | Payer: Medicare Other | Source: Ambulatory Visit | Attending: Surgery | Admitting: Surgery

## 2018-05-21 DIAGNOSIS — K432 Incisional hernia without obstruction or gangrene: Secondary | ICD-10-CM

## 2018-05-21 MED ORDER — IOPAMIDOL (ISOVUE-300) INJECTION 61%
100.0000 mL | Freq: Once | INTRAVENOUS | Status: AC | PRN
  Administered 2018-05-21: 100 mL via INTRAVENOUS

## 2021-06-21 ENCOUNTER — Ambulatory Visit: Payer: Self-pay | Admitting: Allergy

## 2021-08-16 ENCOUNTER — Ambulatory Visit: Payer: Self-pay | Admitting: Allergy

## 2021-10-11 ENCOUNTER — Ambulatory Visit: Payer: Self-pay | Admitting: Allergy

## 2021-11-08 ENCOUNTER — Ambulatory Visit: Payer: Medicare Other | Admitting: Podiatry

## 2021-11-15 ENCOUNTER — Ambulatory Visit: Payer: Self-pay | Admitting: Allergy

## 2021-11-29 ENCOUNTER — Ambulatory Visit: Payer: Medicare Other | Admitting: Podiatry

## 2021-12-16 ENCOUNTER — Other Ambulatory Visit: Payer: Self-pay | Admitting: Internal Medicine

## 2021-12-16 DIAGNOSIS — Z1231 Encounter for screening mammogram for malignant neoplasm of breast: Secondary | ICD-10-CM

## 2021-12-27 ENCOUNTER — Ambulatory Visit: Payer: Medicare Other

## 2021-12-29 ENCOUNTER — Ambulatory Visit
Admission: RE | Admit: 2021-12-29 | Discharge: 2021-12-29 | Disposition: A | Discharge status: Home / Self Care | Payer: Medicare Other | Source: Ambulatory Visit | Attending: Internal Medicine | Admitting: Internal Medicine

## 2021-12-29 DIAGNOSIS — Z1231 Encounter for screening mammogram for malignant neoplasm of breast: Secondary | ICD-10-CM

## 2022-01-03 ENCOUNTER — Other Ambulatory Visit: Payer: Self-pay | Admitting: Internal Medicine

## 2022-01-03 DIAGNOSIS — R928 Other abnormal and inconclusive findings on diagnostic imaging of breast: Secondary | ICD-10-CM

## 2022-01-13 ENCOUNTER — Encounter: Payer: Self-pay | Admitting: Podiatry

## 2022-01-13 ENCOUNTER — Ambulatory Visit (INDEPENDENT_AMBULATORY_CARE_PROVIDER_SITE_OTHER): Payer: Medicare Other | Admitting: Podiatry

## 2022-01-13 DIAGNOSIS — E119 Type 2 diabetes mellitus without complications: Secondary | ICD-10-CM

## 2022-01-13 DIAGNOSIS — L84 Corns and callosities: Secondary | ICD-10-CM

## 2022-01-13 NOTE — Progress Notes (Signed)
This patient presents to the office for diabetic foot exam.  Patient was referred to the office by  her doctor.  This patient says there is no pain or discomfort in her feet.  No history of infection or drainage.  This patient presents to the office for foot exam due to having a history of diabetes.  Vascular  Dorsalis pedis and posterior tibial pulses are weakly  palpable  B/L.  Capillary return  WNL.  Temperature gradient is  WNL.  Skin turgor  WNL  Sensorium  Senn Weinstein monofilament wire  WNL. Normal tactile sensation.  Nail Exam  Patient has normal nails with no evidence of bacterial or fungal infection.  Orthopedic  Exam  Muscle tone and muscle strength  WNL.  No limitations of motion feet  B/L.  No crepitus or joint effusion noted.  Foot type is unremarkable and digits show no abnormalities.  HAV  B/L.  Skin  No open lesions.  Normal skin texture and turgor. Healing skin lesion left heel.  Diabetes with no complications  Diabetic foot exam was performed.  There is no evidence of vascular or neurologic pathology.  RTC  1 year.   Gardiner Barefoot DPM

## 2022-01-17 ENCOUNTER — Other Ambulatory Visit: Payer: Self-pay | Admitting: Internal Medicine

## 2022-01-17 ENCOUNTER — Ambulatory Visit
Admission: RE | Admit: 2022-01-17 | Discharge: 2022-01-17 | Disposition: A | Discharge status: Home / Self Care | Payer: Medicare Other | Source: Ambulatory Visit | Attending: Internal Medicine | Admitting: Internal Medicine

## 2022-01-17 DIAGNOSIS — N6489 Other specified disorders of breast: Secondary | ICD-10-CM

## 2022-01-17 DIAGNOSIS — R928 Other abnormal and inconclusive findings on diagnostic imaging of breast: Secondary | ICD-10-CM

## 2022-01-19 ENCOUNTER — Ambulatory Visit
Admission: RE | Admit: 2022-01-19 | Discharge: 2022-01-19 | Disposition: A | Discharge status: Home / Self Care | Payer: Medicare Other | Source: Ambulatory Visit | Attending: Internal Medicine | Admitting: Internal Medicine

## 2022-01-19 DIAGNOSIS — N6489 Other specified disorders of breast: Secondary | ICD-10-CM

## 2022-01-23 NOTE — Progress Notes (Signed)
Initial phone contact with newly diagnosed pt. Appointment arranged for surgical consult with Dr. Lilia Pro for Friday, June 23,2023 at 9:30. Pt is aware of time and location. Encouraged pt to pick up copy of "The Breast Cancer Treatment Handbook" at the front desk of the [facility]. Navigator contact info given.Encourgaged pt to call with questions or concerns.

## 2022-02-22 ENCOUNTER — Inpatient Hospital Stay: Payer: Medicare Other

## 2022-02-22 ENCOUNTER — Other Ambulatory Visit: Payer: Self-pay | Admitting: Oncology

## 2022-02-22 ENCOUNTER — Encounter: Payer: Self-pay | Admitting: Oncology

## 2022-02-22 ENCOUNTER — Inpatient Hospital Stay: Payer: Medicare Other | Attending: Oncology | Admitting: Oncology

## 2022-02-22 DIAGNOSIS — C50919 Malignant neoplasm of unspecified site of unspecified female breast: Secondary | ICD-10-CM | POA: Insufficient documentation

## 2022-02-22 DIAGNOSIS — C50411 Malignant neoplasm of upper-outer quadrant of right female breast: Secondary | ICD-10-CM

## 2022-02-22 DIAGNOSIS — Z170 Estrogen receptor positive status [ER+]: Secondary | ICD-10-CM

## 2022-02-22 MED ORDER — ANASTROZOLE 1 MG PO TABS: 1.0000 mg | ORAL_TABLET | Freq: Every day | ORAL | 3 refills | Status: DC

## 2022-02-22 NOTE — Progress Notes (Signed)
[facility]  [address] [place],  [place]  [address] [phone]  Clinic Day:  02/22/2022  Referring physician: Thomes Dinning, MD   HISTORY OF PRESENT ILLNESS:  Tracie Frank is a 71 y.o. female who I was asked to consult upon for newly diagnosed breast cancer.  In May 2023, the patient underwent a screening mammogram, which showed a suspicious lesion in her right breast.  This was further confirmed by diagnostic mammogram in June 2023, which showed a 10 mm mass in the upper outer right quadrant.  This lesion was ultimately biopsied, whose pathology showed invasive carcinoma.  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  Receptor testing showed her tumor to be estrogen receptor positive, progesterone receptor positive, but Her2/neu receptor negative.  One sentinel lymph nodes were removed, which came back negative for nodal involvement.  She comes in today to go over all of her breast cancer pathology is implications.  Of note, the patient never noticed any new symptoms/findings in her right breast which ever led her to cancer being present.  Per hospital records, the patient had not undergone a mammogram in 4 years.  To her knowledge, there is no family history of breast or ovarian cancer.  PAST MEDICAL HISTORY:   Past Medical History:  Diagnosis Date   Allergy    Anxiety    Arthritis    Breast cancer ([place])    Cushing's disease ([place])    Depression    Diabetes mellitus 10 years ago   pt states is currently not taking any medications for DM   Headache    History of frequent urinary tract infections    Hyperlipidemia    Hypertension    pt states was on BP meds in past but is currently not having to take medications    Motion sickness    Peripheral vascular disease ([place])    right leg sig. and left leg moderate   Pneumonia     PONV (postoperative nausea and vomiting)    Sleep apnea    Wears glasses     PAST SURGICAL HISTORY:   Past Surgical History:  Procedure Laterality Date   ADRENALECTOMY  ~8-10   Left   AXILLARY SENTINEL NODE BIOPSY Right    bladder polyp     BREAST BIOPSY Right    BREAST LUMPECTOMY Right    COLONOSCOPY     DILATION AND CURETTAGE OF UTERUS     X 3   ENDOMETRIAL ABLATION     HERNIA REPAIR     UPPER LEFT SIDE   JOINT REPLACEMENT     left knee 2013   SPINAL CORD STIMULATOR INSERTION N/A 01/23/2018   Procedure: LUMBAR SPINAL CORD STIMULATOR INSERTION;  Surgeon: Melina Schools, MD;  Location: [facility];  Service: Orthopedics;  Laterality: N/A;  3 hrs   TONSILLECTOMY     TONSILLECTOMY AND ADENOIDECTOMY     TOTAL HIP ARTHROPLASTY Right 01/11/2016   Procedure: RIGHT TOTAL HIP ARTHROPLASTY ANTERIOR APPROACH;  Surgeon: Paralee Cancel, MD;  Location: [facility];  Service: Orthopedics;  Laterality: Right;    CURRENT MEDICATIONS:   Current Outpatient Medications  Medication Sig Dispense Refill   ALPRAZolam (XANAX) 0.25 MG tablet Take 0.25 mg by mouth at bedtime as needed for anxiety or sleep.      anastrozole (ARIMIDEX) 1 MG tablet Take 1 tablet (1 mg total) by  mouth daily. 90 tablet 3   atorvastatin (LIPITOR) 40 MG tablet Take 40 mg by mouth at bedtime.     Calcium Carbonate-Vitamin D (CALTRATE 600+D) 600-400 MG-UNIT tablet Take 1 tablet by mouth daily.     cetirizine (ZYRTEC) 10 MG tablet Take 10 mg by mouth 2 (two) times daily as needed for allergies.     Cholecalciferol (VITAMIN D3) 2000 units TABS Take 2,000 Units by mouth daily.     dicyclomine (BENTYL) 20 MG tablet Take 20 mg by mouth every 6 (six) hours as needed (for IBS).     docusate sodium (COLACE) 100 MG capsule Take 100 mg by mouth 2 (two) times daily as needed (for constipation.).     FLUoxetine (PROZAC) 40 MG capsule Take 40 mg by mouth daily.       ketotifen (ZADITOR) 0.025 % ophthalmic solution Place 1 drop into both eyes 2 (two)  times daily as needed (for itchy eyes.).     L-Lysine 500 MG TABS Take 500 mg by mouth daily as needed (for fever blister).     lisinopril (PRINIVIL,ZESTRIL) 10 MG tablet Take 10 mg by mouth daily.     Multiple Vitamins-Minerals (HEALTHY EYES PO) Take 1 tablet by mouth daily.     ondansetron (ZOFRAN ODT) 4 MG disintegrating tablet Take 1 tablet (4 mg total) by mouth every 8 (eight) hours as needed. 20 tablet 0   oxybutynin (DITROPAN-XL) 10 MG 24 hr tablet Take 10 mg by mouth at bedtime.     vitamin C (ASCORBIC ACID) 500 MG tablet Take 500 mg by mouth daily.     No current facility-administered medications for this visit.    ALLERGIES:   Allergies  Allergen Reactions   Gabapentin Other (See Comments)    Other reaction(s): Other Twitching, loss of bladder control   Lyrica [Pregabalin] Other (See Comments)    Jerking movements; confusion; incontinence   Morphine And Related Nausea And Vomiting   Other Nausea And Vomiting    anesthesia    FAMILY HISTORY:   Family History  Problem Relation Age of Onset   Dementia Mother    Hypertension Sister    Heart disease Brother    Hypertension Brother    Hyperlipidemia Brother    Obesity Brother    Diabetes Brother    Cancer Maternal Aunt        leukemia   Heart disease Maternal Grandmother    Stroke Maternal Grandmother     SOCIAL HISTORY:  The patient was born and raised in [place].  She currently lives in town.  She is divorced, with 2 children, 2 grandchildren, and 2 great-grandchildren.  She previously had fast food work.  She also worked for numerous years at [employer].  She did smoke a pack of cigarettes daily for 60 years before quitting 37 years ago.  She drinks alcohol on rare occasions.  REVIEW OF SYSTEMS:  Review of Systems  Constitutional:  Negative for fatigue and fever.  HENT:   Negative for hearing loss and sore throat.   Eyes:  Negative for eye problems.  Respiratory:  Negative for chest tightness, cough and  hemoptysis.   Cardiovascular:  Negative for chest pain and palpitations.  Gastrointestinal:  Positive for diarrhea and nausea. Negative for abdominal distention, abdominal pain, blood in stool, constipation and vomiting.  Endocrine: Negative for hot flashes.  Genitourinary:  Negative for difficulty urinating, dysuria, frequency, hematuria and nocturia.   Musculoskeletal:  Positive for arthralgias. Negative for back pain, gait problem and  myalgias.  Skin: Negative.  Negative for itching and rash.  Neurological: Negative.  Negative for dizziness, extremity weakness, gait problem, headaches, light-headedness and numbness.  Hematological: Negative.   Psychiatric/Behavioral:  Positive for depression. Negative for suicidal ideas. The patient is nervous/anxious.    PHYSICAL EXAM:  Blood pressure 125/87, pulse 64, temperature 98.4 F (36.9 C), resp. rate 16, height 5' 1"  (1.549 m), weight 223 lb 4.8 oz (101.3 kg), SpO2 95 %. Wt Readings from Last 3 Encounters:  02/22/22 223 lb 4.8 oz (101.3 kg)  01/24/18 237 lb (107.5 kg)  01/15/18 237 lb 8 oz (107.7 kg)   Body mass index is 42.19 kg/m. Performance status (ECOG): 1 - Symptomatic but completely ambulatory Physical Exam Constitutional:      Appearance: Normal appearance.  HENT:     Mouth/Throat:     Pharynx: Oropharynx is clear. No oropharyngeal exudate.  Cardiovascular:     Rate and Rhythm: Normal rate and regular rhythm.     Heart sounds: No murmur heard.    No friction rub. No gallop.  Pulmonary:     Breath sounds: Normal breath sounds.  Chest:  Breasts:    Right: No swelling, bleeding, inverted nipple, mass, nipple discharge or skin change.     Left: No swelling, bleeding, inverted nipple, mass, nipple discharge or skin change.     Comments: Her right breast mass is healed very well from her recent lumpectomy. Abdominal:     General: Bowel sounds are normal. There is no distension.     Palpations: Abdomen is soft. There is no mass.      Tenderness: There is no abdominal tenderness.  Musculoskeletal:        General: No tenderness.     Cervical back: Normal range of motion and neck supple.     Right lower leg: No edema.     Left lower leg: No edema.  Lymphadenopathy:     Cervical: No cervical adenopathy.     Right cervical: No superficial, deep or posterior cervical adenopathy.    Left cervical: No superficial, deep or posterior cervical adenopathy.     Upper Body:     Right upper body: No supraclavicular or axillary adenopathy.     Left upper body: No supraclavicular or axillary adenopathy.     Lower Body: No right inguinal adenopathy. No left inguinal adenopathy.  Skin:    Coloration: Skin is not jaundiced.     Findings: No lesion or rash.  Neurological:     General: No focal deficit present.     Mental Status: She is alert and oriented to person, place, and time. Mental status is at baseline.  Psychiatric:        Mood and Affect: Mood normal.        Behavior: Behavior normal.        Thought Content: Thought content normal.        Judgment: Judgment normal.     ASSESSMENT & PLAN:  A 71 y.o. female who I was asked to consult upon for newly diagnosed stage IA (T1c N0 M0) hormone positive breast cancer, status post a right breast lumpectomy in June 2023.  There is nothing particularly ominous about her breast cancer pathology to where I believe adjuvant chemotherapy is warranted.  As all of her surgical margins are negative, I also do not think there is a role for adjuvant breast radiation.  As her cancer is hormone sensitive, I will place her on anastrozole 1 mg daily, which she  will take for 5 years for her adjuvant endocrine therapy.  Moving forward, her breast cancer surveillance will consist of clinical breast exams every 4-6 months.  She will also undergo annual mammograms for her continued radiographic breast cancer surveillance.  Clinically, the patient appears to be doing well.  I will see her back in  November 2023 for her next clinical breast exam.  The patient understands all the plans discussed today and is in agreement with them.  I do appreciate Thomes Dinning, MD for his new consult. so   Derren Suydam Macarthur Critchley, MD

## 2022-03-09 ENCOUNTER — Other Ambulatory Visit: Payer: Self-pay | Admitting: Hematology and Oncology

## 2022-03-09 ENCOUNTER — Telehealth: Payer: Self-pay

## 2022-03-09 DIAGNOSIS — C50411 Malignant neoplasm of upper-outer quadrant of right female breast: Secondary | ICD-10-CM

## 2022-03-09 NOTE — Progress Notes (Signed)
Spoke with [facility] and scheduled transportation for DOS 03/14/2022.

## 2022-03-09 NOTE — Telephone Encounter (Signed)
Forwarded this to scheduling.

## 2022-03-09 NOTE — Telephone Encounter (Signed)
-----   Message from Marvia Pickles, PA-C sent at 03/08/2022  2:54 PM EDT ----- Regarding: RE: anastrolzole I can see her Friday ----- Message ----- From: Georgette Shell, RN Sent: 03/08/2022   2:21 PM EDT To: Melodye Ped, NP; Marvia Pickles, PA-C Subject: Rise Mu from Dr. Maxie Barb office wants [place] to see patient d/t side effects from anastrolzole. He notes states several different issues but did not list them.  I have asked Pam to inquiry what they are.  Currently she has stopped anastrozole.

## 2022-03-10 ENCOUNTER — Ambulatory Visit: Payer: Medicare Other | Admitting: Hematology and Oncology

## 2022-03-10 ENCOUNTER — Other Ambulatory Visit: Payer: Medicare Other

## 2022-03-14 ENCOUNTER — Other Ambulatory Visit: Payer: Medicare Other

## 2022-03-14 ENCOUNTER — Ambulatory Visit: Payer: Medicare Other | Admitting: Hematology and Oncology

## 2022-03-22 ENCOUNTER — Other Ambulatory Visit: Payer: Self-pay | Admitting: Oncology

## 2022-03-22 ENCOUNTER — Inpatient Hospital Stay: Payer: Medicare Other | Attending: Oncology

## 2022-03-22 ENCOUNTER — Inpatient Hospital Stay (INDEPENDENT_AMBULATORY_CARE_PROVIDER_SITE_OTHER): Payer: Medicare Other | Admitting: Oncology

## 2022-03-22 VITALS — BP 142/81 | HR 68 | Temp 98.4°F | Resp 14 | Ht 61.0 in | Wt 220.3 lb

## 2022-03-22 DIAGNOSIS — C50412 Malignant neoplasm of upper-outer quadrant of left female breast: Secondary | ICD-10-CM

## 2022-03-22 DIAGNOSIS — C50411 Malignant neoplasm of upper-outer quadrant of right female breast: Secondary | ICD-10-CM

## 2022-03-22 DIAGNOSIS — C50421 Malignant neoplasm of upper-outer quadrant of right male breast: Secondary | ICD-10-CM

## 2022-03-22 LAB — COMPREHENSIVE METABOLIC PANEL
Albumin: 4.3 (ref 3.5–5.0)
Calcium: 10.1 (ref 8.7–10.7)

## 2022-03-22 LAB — CBC AND DIFFERENTIAL
HCT: 44 (ref 36–46)
Hemoglobin: 14.9 (ref 12.0–16.0)
Neutrophils Absolute: 5.49
Platelets: 328 10*3/uL (ref 150–400)
WBC: 11.2

## 2022-03-22 LAB — BASIC METABOLIC PANEL
BUN: 17 (ref 4–21)
CO2: 25 — AB (ref 13–22)
Chloride: 107 (ref 99–108)
Creatinine: 0.6 (ref 0.5–1.1)
Glucose: 150
Potassium: 3.7 mEq/L (ref 3.5–5.1)
Sodium: 140 (ref 137–147)

## 2022-03-22 LAB — HEPATIC FUNCTION PANEL
ALT: 25 U/L (ref 7–35)
AST: 28 (ref 13–35)
Alkaline Phosphatase: 115 (ref 25–125)
Bilirubin, Total: 0.7

## 2022-03-22 LAB — CBC: RBC: 4.45 (ref 3.87–5.11)

## 2022-03-22 MED ORDER — TAMOXIFEN CITRATE 20 MG PO TABS: 20.0000 mg | ORAL_TABLET | Freq: Every day | ORAL | 3 refills | Status: AC

## 2022-03-22 NOTE — Progress Notes (Signed)
Face to face visit with Tracie Frank who is here for a follow up with Dr. Abbott Pao reports that she has recovered from surgery but she is having side effects from the Anastrozole and hopes to speak to Dr. Bobby Rumpf about changing the medicaiton.

## 2022-03-22 NOTE — Progress Notes (Signed)
[facility]  [address] [place],  [place]  [address] [phone]  Clinic Day:  03/22/2022  Referring physician: Thomes Dinning, MD  HISTORY OF PRESENT ILLNESS:  The patient is a 71 y.o. female with newly diagnosed stage IA (T1c N0 M0) hormone positive breast cancer, status post a right breast lumpectomy in June 2023.  The patient has been taking anastrozole for her adjuvant endocrine therapy.  However, she comes in today complaining that her anastrozole has caused significant side effects, including nausea, diarrhea, and weakness.  Based upon these symptoms, she discontinued this medicine last week.  She claims all of the aforementioned symptoms have abated since her anastrozole was discontinued.  She comes in today to discuss taking another endocrine agent that could keep her breast cancer under control without her incurring significant side effects.  She continues to deny having any particular changes in her breasts which concern her for early disease recurrence.  There were no vitals taken for this visit. Wt Readings from Last 3 Encounters:  02/22/22 223 lb 4.8 oz (101.3 kg)  01/24/18 237 lb (107.5 kg)  01/15/18 237 lb 8 oz (107.7 kg)   There is no height or weight on file to calculate BMI. Performance status (ECOG): 1 - Symptomatic but completely ambulatory Physical Exam Constitutional:      Appearance: Normal appearance.  HENT:     Mouth/Throat:     Pharynx: Oropharynx is clear. No oropharyngeal exudate.  Cardiovascular:     Rate and Rhythm: Normal rate and regular rhythm.     Heart sounds: No murmur heard.    No friction rub. No gallop.  Pulmonary:     Breath sounds: Normal breath sounds.  Chest:  Breasts:    Right: No swelling, bleeding, inverted nipple, mass, nipple discharge or skin change.     Left: No swelling, bleeding, inverted nipple, mass, nipple discharge or skin change.     Comments: Her right breast mass is healed very  well from her recent lumpectomy. Abdominal:     General: Bowel sounds are normal. There is no distension.     Palpations: Abdomen is soft. There is no mass.     Tenderness: There is no abdominal tenderness.  Musculoskeletal:        General: No tenderness.     Cervical back: Normal range of motion and neck supple.     Right lower leg: No edema.     Left lower leg: No edema.  Lymphadenopathy:     Cervical: No cervical adenopathy.     Right cervical: No superficial, deep or posterior cervical adenopathy.    Left cervical: No superficial, deep or posterior cervical adenopathy.     Upper Body:     Right upper body: No supraclavicular or axillary adenopathy.     Left upper body: No supraclavicular or axillary adenopathy.     Lower Body: No right inguinal adenopathy. No left inguinal adenopathy.  Skin:    Coloration: Skin is not jaundiced.     Findings: No lesion or rash.  Neurological:     General: No focal deficit present.     Mental Status: She is alert and oriented to person, place, and time. Mental status is at baseline.  Psychiatric:        Mood and Affect: Mood normal.        Behavior: Behavior normal.        Thought Content: Thought content normal.        Judgment:  Judgment normal.    ASSESSMENT & PLAN:  A 71 y.o. female with stage IA (T1c N0 M0) hormone positive breast cancer, status post a right breast lumpectomy in June 2023.  Based upon the problems she has had with anastrozole, I will switch her to tamoxifen 20 mg daily.  The patient understands that many endocrine therapies have similar properties for which the same symptoms may develop irrespective of which agent she takes.  Nevertheless, I encouraged her to contact our office over these next few weeks if she has any problems with tamoxifen to where another treatment change may need to be considered.  Otherwise, I will see this patient back in 4 months for her next clinical breast exam.  The patient understands all the plans  discussed today and is in agreement with them.  Baudelio Karnes Macarthur Critchley, MD

## 2022-03-24 ENCOUNTER — Telehealth: Payer: Self-pay

## 2022-03-24 NOTE — Telephone Encounter (Signed)
Pt called and stated that [facility] had told her they didn't receive prescription from Dr Bobby Rumpf. I told pt I would call pharmacy and check on things.

## 2022-06-27 ENCOUNTER — Ambulatory Visit: Payer: Medicare Other | Admitting: Oncology

## 2022-07-20 NOTE — Progress Notes (Signed)
[facility]  [address] [place],  [place]  [address] [phone]  Clinic Day:  07/21/2022  Referring physician: Thomes Dinning, MD  HISTORY OF PRESENT ILLNESS:  The patient is a 71 y.o. female with stage IA (T1c N0 M0) hormone positive breast cancer, status post a right breast lumpectomy in June 2023.  The patient was initially started on anastrozole for her adjuvant endocrine therapy.  However, at this agent, numerous side effects, she was switched to tamoxifen at her last visit.  Unfortunately, her tamoxifen causes her numerous side effects as well, including headaches, myalgias, and a general sense of not feeling well.  Based upon these findings, the patient is not convinced she can receive any form of endocrine therapy for her adjuvant disease management.  Fortunately, she continues to deny having any particular changes in her breasts which concern her for early disease recurrence.  PHYSICAL EXAM: Blood pressure (!) 144/79, pulse 65, temperature 98.6 F (37 C), resp. rate 16, height '5\' 1"'$  (1.549 m), weight 220 lb 4.8 oz (99.9 kg), SpO2 95 %. Wt Readings from Last 3 Encounters:  07/21/22 220 lb 4.8 oz (99.9 kg)  03/22/22 220 lb 4.8 oz (99.9 kg)  02/22/22 223 lb 4.8 oz (101.3 kg)   Body mass index is 41.63 kg/m. Performance status (ECOG): 1 - Symptomatic but completely ambulatory Physical Exam Constitutional:      Appearance: Normal appearance.  HENT:     Mouth/Throat:     Pharynx: Oropharynx is clear. No oropharyngeal exudate.  Cardiovascular:     Rate and Rhythm: Normal rate and regular rhythm.     Heart sounds: No murmur heard.    No friction rub. No gallop.  Pulmonary:     Breath sounds: Normal breath sounds.  Chest:  Breasts:    Right: No swelling, bleeding, inverted nipple, mass, nipple discharge or skin change.     Left: No swelling, bleeding, inverted nipple, mass, nipple discharge or skin change.  Abdominal:     General:  Bowel sounds are normal. There is no distension.     Palpations: Abdomen is soft. There is no mass.     Tenderness: There is no abdominal tenderness.  Musculoskeletal:        General: No tenderness.     Cervical back: Normal range of motion and neck supple.     Right lower leg: No edema.     Left lower leg: No edema.  Lymphadenopathy:     Cervical: No cervical adenopathy.     Right cervical: No superficial, deep or posterior cervical adenopathy.    Left cervical: No superficial, deep or posterior cervical adenopathy.     Upper Body:     Right upper body: No supraclavicular or axillary adenopathy.     Left upper body: No supraclavicular or axillary adenopathy.     Lower Body: No right inguinal adenopathy. No left inguinal adenopathy.  Skin:    Coloration: Skin is not jaundiced.     Findings: No lesion or rash.  Neurological:     General: No focal deficit present.     Mental Status: She is alert and oriented to person, place, and time. Mental status is at baseline.  Psychiatric:        Mood and Affect: Mood normal.        Behavior: Behavior normal.        Thought Content: Thought content normal.        Judgment: Judgment normal.  ASSESSMENT & PLAN:  A 71 y.o. female with stage IA (T1c N0 M0) hormone positive breast cancer, status post a right breast lumpectomy in June 2023.  Due to her having numerous problems with 2 endocrine agents for her breast cancer, the mutual decision was made to discontinue any further endocrine therapy.  However, I do want this patient to notify our office next week to let us know if the side effects she has had from her tamoxifen have dissipated.  Otherwise, I will see this patient back in 4 months for her next clinical breast exam.  The patient understands all the plans discussed today and is in agreement with them.  Tracie Santana Macarthur Critchley, MD

## 2022-07-21 ENCOUNTER — Inpatient Hospital Stay: Payer: Medicare Other

## 2022-07-21 ENCOUNTER — Inpatient Hospital Stay: Payer: Medicare Other | Attending: Oncology | Admitting: Oncology

## 2022-07-21 ENCOUNTER — Telehealth: Payer: Self-pay | Admitting: Oncology

## 2022-07-21 VITALS — BP 144/79 | HR 65 | Temp 98.6°F | Resp 16 | Ht 61.0 in | Wt 220.3 lb

## 2022-07-21 DIAGNOSIS — Z170 Estrogen receptor positive status [ER+]: Secondary | ICD-10-CM | POA: Diagnosis not present

## 2022-07-21 DIAGNOSIS — C50411 Malignant neoplasm of upper-outer quadrant of right female breast: Secondary | ICD-10-CM | POA: Diagnosis not present

## 2022-07-21 NOTE — Telephone Encounter (Signed)
07/21/22 Next appt scheduled and confirmed with patient

## 2022-07-21 NOTE — Progress Notes (Signed)
Face to face  contact with pt in [place]. Pt is here today for Med Onc follow up with Dr. Bobby Rumpf. Pt is having difficulty tolerating Tamoxifen. Encouraged pt to talk with Dr, Bobby Rumpf about the side effects that she is experiencing.

## 2022-10-10 ENCOUNTER — Encounter: Payer: Self-pay | Admitting: Gastroenterology

## 2022-11-09 ENCOUNTER — Telehealth: Payer: Self-pay | Admitting: *Deleted

## 2022-11-09 ENCOUNTER — Ambulatory Visit: Payer: 59

## 2022-11-09 NOTE — Progress Notes (Unsigned)
Pt's pre-visit is done over the phone and all paperwork (prep instructions) sent to patient. Pt's name and DOB verified at the beginning of the pre-visit. Pt denies any difficulty with ambulating.  No egg or soy allergy known to patient  No issues known to pt with past sedation with any surgeries or procedures Pt denies having issues being intubated Patient denies ever being intubated Pt has no issues moving head neck or swallowing No FH of Malignant Hyperthermia Pt is not on diet pills Pt is not on home 02  Pt is not on blood thinners  Pt denies issues with constipation  Pt has frequent issues with constipation RN instructed pt to use Miralax per bottles instructions a week before prep days. Pt states they will Pt is not on dialysis Pt denies any upcoming cardiac testing Pt encouraged to use to use Singlecare or Goodrx to reduce cost  Patient's chart reviewed by Osvaldo Angst CNRA prior to pre-visit and patient appropriate for the [facility].  Pre-visit completed and red dot placed by patient's name on their procedure day (on provider's schedule).  . Visit by phone Pt states  weight is  Instructions reviewed with pt and pt states understanding. Instructed to review again prior to procedure. Pt states they will.  Instructions sent by mail with coupon and by my chart

## 2022-11-09 NOTE — Telephone Encounter (Signed)
Attempt to reach pt for pre-visit. Unable to leave message due to busy signal. 2nd attempt after 10 minutes  to reach pt for pre-visit. LM with call back # and instructed pt to call and reschedule pre-visit by end of day or both pre-visit and procedure will be canceled per protocol.

## 2022-11-10 ENCOUNTER — Other Ambulatory Visit: Payer: Self-pay | Admitting: Oncology

## 2022-11-14 ENCOUNTER — Ambulatory Visit (AMBULATORY_SURGERY_CENTER): Payer: 59

## 2022-11-14 VITALS — Ht 64.0 in | Wt 225.0 lb

## 2022-11-14 DIAGNOSIS — Z1211 Encounter for screening for malignant neoplasm of colon: Secondary | ICD-10-CM

## 2022-11-14 MED ORDER — NA SULFATE-K SULFATE-MG SULF 17.5-3.13-1.6 GM/177ML PO SOLN: 1.0000 | Freq: Once | ORAL | 0 refills | Status: AC

## 2022-11-14 NOTE — Progress Notes (Signed)
No egg or soy allergy known to patient  No issues known to pt with past sedation with any surgeries or procedures Patient denies ever being told they had issues or difficulty with intubation  No FH of Malignant Hyperthermia Pt is not on diet pills Pt is not on  home 02  Pt is not on blood thinners  Pt denies issues with constipation  No A fib or A flutter Have any cardiac testing pending--no Pt instructed to use Singlecare.com or GoodRx for a price reduction on prep   

## 2022-11-20 ENCOUNTER — Ambulatory Visit: Payer: Medicare Other | Admitting: Oncology

## 2022-11-20 NOTE — Progress Notes (Deleted)
[facility]  [address] [place],  [place]  [address] [phone]  Clinic Day:  07/21/2022  Referring physician: Worthy Rancher, MD  HISTORY OF PRESENT ILLNESS:  The patient is a 72 y.o. female with stage IA (T1c N0 M0) hormone positive breast cancer, status post a right breast lumpectomy in June 2023.  The patient was initially started on anastrozole for her adjuvant endocrine therapy.  However, at this agent, numerous side effects, she was switched to tamoxifen at her last visit.  Unfortunately, her tamoxifen causes her numerous side effects as well, including headaches, myalgias, and a general sense of not feeling well.  Based upon these findings, the patient is not convinced she can receive any form of endocrine therapy for her adjuvant disease management.  Fortunately, she continues to deny having any particular changes in her breasts which concern her for early disease recurrence.  PHYSICAL EXAM: There were no vitals taken for this visit. Wt Readings from Last 3 Encounters:  11/14/22 225 lb (102.1 kg)  07/21/22 220 lb 4.8 oz (99.9 kg)  03/22/22 220 lb 4.8 oz (99.9 kg)   There is no height or weight on file to calculate BMI. Performance status (ECOG): 1 - Symptomatic but completely ambulatory Physical Exam Constitutional:      Appearance: Normal appearance.  HENT:     Mouth/Throat:     Pharynx: Oropharynx is clear. No oropharyngeal exudate.  Cardiovascular:     Rate and Rhythm: Normal rate and regular rhythm.     Heart sounds: No murmur heard.    No friction rub. No gallop.  Pulmonary:     Breath sounds: Normal breath sounds.  Chest:  Breasts:    Right: No swelling, bleeding, inverted nipple, mass, nipple discharge or skin change.     Left: No swelling, bleeding, inverted nipple, mass, nipple discharge or skin change.  Abdominal:     General: Bowel sounds are normal. There is no distension.     Palpations: Abdomen is soft. There is  no mass.     Tenderness: There is no abdominal tenderness.  Musculoskeletal:        General: No tenderness.     Cervical back: Normal range of motion and neck supple.     Right lower leg: No edema.     Left lower leg: No edema.  Lymphadenopathy:     Cervical: No cervical adenopathy.     Right cervical: No superficial, deep or posterior cervical adenopathy.    Left cervical: No superficial, deep or posterior cervical adenopathy.     Upper Body:     Right upper body: No supraclavicular or axillary adenopathy.     Left upper body: No supraclavicular or axillary adenopathy.     Lower Body: No right inguinal adenopathy. No left inguinal adenopathy.  Skin:    Coloration: Skin is not jaundiced.     Findings: No lesion or rash.  Neurological:     General: No focal deficit present.     Mental Status: She is alert and oriented to person, place, and time. Mental status is at baseline.  Psychiatric:        Mood and Affect: Mood normal.        Behavior: Behavior normal.        Thought Content: Thought content normal.        Judgment: Judgment normal.    ASSESSMENT & PLAN:  A 72 y.o. female with stage IA (T1c N0 M0) hormone positive breast cancer,  status post a right breast lumpectomy in June 2023.  Due to her having numerous problems with 2 endocrine agents for her breast cancer, the mutual decision was made to discontinue any further endocrine therapy.  However, I do want this patient to notify our office next week to let us know if the side effects she has had from her tamoxifen have dissipated.  Otherwise, I will see this patient back in 4 months for her next clinical breast exam.  The patient understands all the plans discussed today and is in agreement with them.  Tracie Taussig Kirby Funk, MD

## 2022-12-05 ENCOUNTER — Ambulatory Visit (AMBULATORY_SURGERY_CENTER): Payer: 59 | Admitting: Gastroenterology

## 2022-12-05 ENCOUNTER — Encounter: Payer: Self-pay | Admitting: Gastroenterology

## 2022-12-05 VITALS — BP 121/98 | HR 67 | Temp 98.0°F | Resp 14 | Ht 61.0 in

## 2022-12-05 DIAGNOSIS — Z86010 Personal history of colonic polyps: Secondary | ICD-10-CM | POA: Diagnosis not present

## 2022-12-05 DIAGNOSIS — K635 Polyp of colon: Secondary | ICD-10-CM

## 2022-12-05 DIAGNOSIS — Z09 Encounter for follow-up examination after completed treatment for conditions other than malignant neoplasm: Secondary | ICD-10-CM | POA: Diagnosis not present

## 2022-12-05 DIAGNOSIS — D122 Benign neoplasm of ascending colon: Secondary | ICD-10-CM | POA: Diagnosis not present

## 2022-12-05 DIAGNOSIS — D120 Benign neoplasm of cecum: Secondary | ICD-10-CM

## 2022-12-05 DIAGNOSIS — D123 Benign neoplasm of transverse colon: Secondary | ICD-10-CM

## 2022-12-05 DIAGNOSIS — Z1211 Encounter for screening for malignant neoplasm of colon: Secondary | ICD-10-CM

## 2022-12-05 MED ORDER — SODIUM CHLORIDE 0.9 % IV SOLN: 500.0000 mL | Freq: Once | INTRAVENOUS | Status: DC

## 2022-12-05 NOTE — Op Note (Signed)
[facility] Patient Name: Tracie Frank Procedure Date: 12/05/2022 10:57 AM MRN: [id number] Endoscopist: Lynann Bologna , MD, [id number] Age: 72 Referring MD:  Date of Birth: Oct 08, 1950 Gender: Female Account #: [id number] Procedure:                Colonoscopy Indications:              High risk colon cancer surveillance: Personal                            history of colonic polyps Medicines:                Monitored Anesthesia Care Procedure:                Pre-Anesthesia Assessment:                           - Prior to the procedure, a History and Physical                            was performed, and patient medications and                            allergies were reviewed. The patient's tolerance of                            previous anesthesia was also reviewed. The risks                            and benefits of the procedure and the sedation                            options and risks were discussed with the patient.                            All questions were answered, and informed consent                            was obtained. Prior Anticoagulants: The patient has                            taken no anticoagulant or antiplatelet agents. ASA                            Grade Assessment: II - A patient with mild systemic                            disease. After reviewing the risks and benefits,                            the patient was deemed in satisfactory condition to                            undergo the procedure.  After obtaining informed consent, the colonoscope                            was passed under direct vision. Throughout the                            procedure, the patient's blood pressure, pulse, and                            oxygen saturations were monitored continuously. The                            Olympus PCF-H190DL [id number]) Colonoscope was                            introduced through the anus and  advanced to the the                            cecum, identified by appendiceal orifice and                            ileocecal valve. The colonoscopy was performed                            without difficulty. The patient tolerated the                            procedure well. The quality of the bowel                            preparation was good. The ileocecal valve,                            appendiceal orifice, and rectum were photographed. Scope In: 11:01:46 AM Scope Out: 11:19:32 AM Scope Withdrawal Time: 0 hours 12 minutes 9 seconds  Total Procedure Duration: 0 hours 17 minutes 46 seconds  Findings:                 Three sessile polyps were found in the ascending                            colon(2) and cecum. The polyps were 2 to 4 mm in                            size. These polyps were removed with a cold biopsy                            forceps. Resection and retrieval were complete.                           A 6 mm polyp was found in the distal transverse                            colon. The  polyp was sessile. The polyp was removed                            with a cold snare. Resection and retrieval were                            complete.                           Multiple medium-mouthed diverticula were found in                            the sigmoid colon and few in descending colon. Rare                            diverticula in the ascending colon.                           Non-bleeding internal hemorrhoids were found during                            retroflexion. The hemorrhoids were small and Grade                            I (internal hemorrhoids that do not prolapse).                           The exam was otherwise without abnormality on                            direct and retroflexion views. Complications:            No immediate complications. Estimated Blood Loss:     Estimated blood loss: none. Impression:               - Three 2 to 4 mm polyps in  the ascending colon and                            in the cecum, removed with a cold biopsy forceps.                            Resected and retrieved.                           - One 6 mm polyp in the distal transverse colon,                            removed with a cold snare. Resected and retrieved.                           - Moderate predominantly sigmoid diverticulosis.                           - Non-bleeding internal hemorrhoids.                           -  The examination was otherwise normal on direct                            and retroflexion views. Recommendation:           - Patient has a contact number available for                            emergencies. The signs and symptoms of potential                            delayed complications were discussed with the                            patient. Return to normal activities tomorrow.                            Written discharge instructions were provided to the                            patient.                           - High fiber diet.                           - Continue present medications.                           - Await pathology results.                           - Repeat colonoscopy for surveillance based on                            pathology results.                           - The findings and recommendations were discussed                            with the designated responsible adult. Lynann Bologna, MD 12/05/2022 11:28:40 AM This report has been signed electronically.

## 2022-12-05 NOTE — Progress Notes (Signed)
Report to PACU, RN, vss, BBS= Clear.  

## 2022-12-05 NOTE — Progress Notes (Signed)
[facility] History and Physical   Primary Care Physician:  Lourena Simmonds, Donald Pore, MD   Reason for Procedure:   History of colon polyps  Plan:     colonoscopy     HPI: Tracie Frank is a 72 y.o. female    Past Medical History:  Diagnosis Date   Allergy    Anxiety    Arthritis    Breast cancer (HCC)    Cushing's disease (HCC)    Depression    Diabetes mellitus 10 years ago   pt states is currently not taking any medications for DM   Headache    History of frequent urinary tract infections    Hyperlipidemia    Hypertension    pt states was on BP meds in past but is currently not having to take medications    Motion sickness    Peripheral vascular disease (HCC)    right leg sig. and left leg moderate   Pneumonia    PONV (postoperative nausea and vomiting)    Sleep apnea    Wears glasses     Past Surgical History:  Procedure Laterality Date   ADRENALECTOMY  ~8-10   Left   AXILLARY SENTINEL NODE BIOPSY Right    bladder polyp     BREAST BIOPSY Right    BREAST LUMPECTOMY Right    COLONOSCOPY     DILATION AND CURETTAGE OF UTERUS     X 3   ENDOMETRIAL ABLATION     HERNIA REPAIR     UPPER LEFT SIDE   JOINT REPLACEMENT     left knee 2013   SPINAL CORD STIMULATOR INSERTION N/A 01/23/2018   Procedure: LUMBAR SPINAL CORD STIMULATOR INSERTION;  Surgeon: Venita Lick, MD;  Location: [facility];  Service: Orthopedics;  Laterality: N/A;  3 hrs   TONSILLECTOMY     TONSILLECTOMY AND ADENOIDECTOMY     TOTAL HIP ARTHROPLASTY Right 01/11/2016   Procedure: RIGHT TOTAL HIP ARTHROPLASTY ANTERIOR APPROACH;  Surgeon: Durene Romans, MD;  Location: [facility];  Service: Orthopedics;  Laterality: Right;    Prior to Admission medications   Medication Sig Start Date End Date Taking? Authorizing Provider  aspirin EC 81 MG tablet Take 1 tablet every day by oral route. 01/10/13  Yes [provider]  buPROPion (WELLBUTRIN XL) 300 MG 24 hr tablet Take 300 mg by mouth  daily.   Yes [provider]  glipiZIDE (GLUCOTROL) 5 MG tablet TAKE 1/2 TABLET BY MOUTH BEFORE BREAKFAST FOR DIABETES 05/15/11  Yes [provider]  HYDROcodone-acetaminophen (NORCO/VICODIN) 5-325 MG tablet Take by mouth.   Yes [provider]  lisinopril (PRINIVIL,ZESTRIL) 10 MG tablet Take 10 mg by mouth daily.   Yes [provider]  oxybutynin (DITROPAN-XL) 10 MG 24 hr tablet Take 10 mg by mouth at bedtime.   Yes [provider]  tamoxifen (NOLVADEX) 20 MG tablet Take 1 tablet (20 mg total) by mouth daily. 03/22/22  Yes Lewis, Dequincy A, MD  ALPRAZolam Prudy Feeler) 0.25 MG tablet Take 0.25 mg by mouth at bedtime as needed for anxiety or sleep.  Patient not taking: Reported on 11/14/2022    [provider]  atorvastatin (LIPITOR) 40 MG tablet Take 40 mg by mouth at bedtime.    [provider]  Calcium Carbonate-Vitamin D (CALTRATE 600+D) 600-400 MG-UNIT tablet Take 1 tablet by mouth daily. Patient not taking: Reported on 11/14/2022    [provider]  cetirizine (ZYRTEC) 10 MG tablet Take 10 mg by mouth 2 (two)  times daily as needed for allergies. Patient not taking: Reported on 11/14/2022    [provider]  Cholecalciferol (VITAMIN D3) 2000 units TABS Take 2,000 Units by mouth daily. Patient not taking: Reported on 11/14/2022    [provider]  dicyclomine (BENTYL) 20 MG tablet Take 20 mg by mouth every 6 (six) hours as needed (for IBS). Patient not taking: Reported on 11/14/2022    [provider]  docusate sodium (COLACE) 100 MG capsule Take 100 mg by mouth 2 (two) times daily as needed (for constipation.). Patient not taking: Reported on 11/14/2022    [provider]  FLUoxetine (PROZAC) 40 MG capsule Take 40 mg by mouth daily.   Patient not taking: Reported on 11/14/2022    [provider]  ketotifen (ZADITOR) 0.025 % ophthalmic solution Place 1 drop into both eyes 2 (two) times daily as  needed (for itchy eyes.). Patient not taking: Reported on 11/14/2022    [provider]  L-Lysine 500 MG TABS Take 500 mg by mouth daily as needed (for fever blister). Patient not taking: Reported on 11/14/2022    [provider]  mirtazapine (REMERON) 15 MG tablet Take 15 mg by mouth at bedtime.    [provider]  Multiple Vitamins-Minerals (HEALTHY EYES PO) Take 1 tablet by mouth daily. Patient not taking: Reported on 11/14/2022    [provider]  ondansetron (ZOFRAN ODT) 4 MG disintegrating tablet Take 1 tablet (4 mg total) by mouth every 8 (eight) hours as needed. Patient not taking: Reported on 11/14/2022 01/23/18   Mayo, Baxter Kail, PA-C  pravastatin (PRAVACHOL) 40 MG tablet Take by mouth. 05/15/11   [provider]  valACYclovir (VALTREX) 500 MG tablet Take 500 mg by mouth 2 (two) times daily. Patient not taking: Reported on 12/05/2022    [provider]  vitamin C (ASCORBIC ACID) 500 MG tablet Take 500 mg by mouth daily. Patient not taking: Reported on 11/14/2022    [provider]    Current Outpatient Medications  Medication Sig Dispense Refill   aspirin EC 81 MG tablet Take 1 tablet every day by oral route.     buPROPion (WELLBUTRIN XL) 300 MG 24 hr tablet Take 300 mg by mouth daily.     glipiZIDE (GLUCOTROL) 5 MG tablet TAKE 1/2 TABLET BY MOUTH BEFORE BREAKFAST FOR DIABETES     HYDROcodone-acetaminophen (NORCO/VICODIN) 5-325 MG tablet Take by mouth.     lisinopril (PRINIVIL,ZESTRIL) 10 MG tablet Take 10 mg by mouth daily.     oxybutynin (DITROPAN-XL) 10 MG 24 hr tablet Take 10 mg by mouth at bedtime.     tamoxifen (NOLVADEX) 20 MG tablet Take 1 tablet (20 mg total) by mouth daily. 90 tablet 3   ALPRAZolam (XANAX) 0.25 MG tablet Take 0.25 mg by mouth at bedtime as needed for anxiety or sleep.  (Patient not taking: Reported on 11/14/2022)     atorvastatin (LIPITOR) 40 MG tablet Take 40 mg by mouth at bedtime.     Calcium  Carbonate-Vitamin D (CALTRATE 600+D) 600-400 MG-UNIT tablet Take 1 tablet by mouth daily. (Patient not taking: Reported on 11/14/2022)     cetirizine (ZYRTEC) 10 MG tablet Take 10 mg by mouth 2 (two) times daily as needed for allergies. (Patient not taking: Reported on 11/14/2022)     Cholecalciferol (VITAMIN D3) 2000 units TABS Take 2,000 Units by mouth daily. (Patient not taking: Reported on 11/14/2022)     dicyclomine (BENTYL) 20 MG tablet Take 20 mg by  mouth every 6 (six) hours as needed (for IBS). (Patient not taking: Reported on 11/14/2022)     docusate sodium (COLACE) 100 MG capsule Take 100 mg by mouth 2 (two) times daily as needed (for constipation.). (Patient not taking: Reported on 11/14/2022)     FLUoxetine (PROZAC) 40 MG capsule Take 40 mg by mouth daily.   (Patient not taking: Reported on 11/14/2022)     ketotifen (ZADITOR) 0.025 % ophthalmic solution Place 1 drop into both eyes 2 (two) times daily as needed (for itchy eyes.). (Patient not taking: Reported on 11/14/2022)     L-Lysine 500 MG TABS Take 500 mg by mouth daily as needed (for fever blister). (Patient not taking: Reported on 11/14/2022)     mirtazapine (REMERON) 15 MG tablet Take 15 mg by mouth at bedtime.     Multiple Vitamins-Minerals (HEALTHY EYES PO) Take 1 tablet by mouth daily. (Patient not taking: Reported on 11/14/2022)     ondansetron (ZOFRAN ODT) 4 MG disintegrating tablet Take 1 tablet (4 mg total) by mouth every 8 (eight) hours as needed. (Patient not taking: Reported on 11/14/2022) 20 tablet 0   pravastatin (PRAVACHOL) 40 MG tablet Take by mouth.     valACYclovir (VALTREX) 500 MG tablet Take 500 mg by mouth 2 (two) times daily. (Patient not taking: Reported on 12/05/2022)     vitamin C (ASCORBIC ACID) 500 MG tablet Take 500 mg by mouth daily. (Patient not taking: Reported on 11/14/2022)     Current Facility-Administered Medications  Medication Dose Route Frequency Provider Last Rate Last Admin   0.9 %  sodium chloride infusion  500 mL  Intravenous Once Lynann Bologna, MD        Allergies as of 12/05/2022 - Review Complete 12/05/2022  Allergen Reaction Noted   Gabapentin Other (See Comments) 01/04/2016   Lyrica [pregabalin] Other (See Comments) 01/04/2016   Morphine and related Nausea And Vomiting 03/17/2011   Other Nausea And Vomiting 01/04/2016    Family History  Problem Relation Age of Onset   Dementia Mother    Hypertension Sister    Heart disease Brother    Hypertension Brother    Hyperlipidemia Brother    Obesity Brother    Diabetes Brother    Cancer Maternal Aunt        leukemia   Heart disease Maternal Grandmother    Stroke Maternal Grandmother    Colon cancer Neg Hx    Colon polyps Neg Hx    Esophageal cancer Neg Hx    Rectal cancer Neg Hx    Stomach cancer Neg Hx     Social History   Socioeconomic History   Marital status: Divorced    Spouse name: Not on file   Number of children: 2   Years of education: GED   Highest education level: Not on file  Occupational History   Occupation: RETIRED  Tobacco Use   Smoking status: Former    Packs/day: 1.00    Years: 15.00    Additional pack years: 0.00    Total pack years: 15.00    Types: Cigarettes    Quit date: 03/16/2000    Years since quitting: 22.7   Smokeless tobacco: Never  Vaping Use   Vaping Use: Never used  Substance and Sexual Activity   Alcohol use: No   Drug use: No   Sexual activity: Not Currently    Birth control/protection: None  Other Topics Concern   Not on file  Social History Narrative   Not on  file   Social Determinants of Health   Financial Resource Strain: Not on file  Food Insecurity: Not on file  Transportation Needs: Not on file  Physical Activity: Not on file  Stress: Not on file  Social Connections: Not on file  Intimate Partner Violence: Not on file    Review of Systems: Positive for none All other review of systems negative except as mentioned in the HPI.  Physical Exam: Vital signs in last 24  hours: @VSRANGES @   General:   Alert,  Well-developed, well-nourished, pleasant and cooperative in NAD Lungs:  Clear throughout to auscultation.   Heart:  Regular rate and rhythm; no murmurs, clicks, rubs,  or gallops. Abdomen:  Soft, nontender and nondistended. Normal bowel sounds.   Neuro/Psych:  Alert and cooperative. Normal mood and affect. A and O x 3    No significant changes were identified.  The patient continues to be an appropriate candidate for the planned procedure and anesthesia.   Edman Circle, MD. [facility] 12/05/2022 11:23 AM@

## 2022-12-05 NOTE — Progress Notes (Signed)
Called to room to assist during endoscopic procedure.  Patient ID and intended procedure confirmed with present staff. Received instructions for my participation in the procedure from the performing physician.  

## 2022-12-05 NOTE — Progress Notes (Signed)
Pt's states no medical or surgical changes since previsit or office visit. 

## 2022-12-05 NOTE — Patient Instructions (Addendum)
Handouts Provided:  Polyps, High Fiber Diet and Diverticulosis  YOU HAD AN ENDOSCOPIC PROCEDURE TODAY AT THE [facility]:   Refer to the procedure report that was given to you for any specific questions about what was found during the examination.  If the procedure report does not answer your questions, please call your gastroenterologist to clarify.  If you requested that your care partner not be given the details of your procedure findings, then the procedure report has been included in a sealed envelope for you to review at your convenience later.  YOU SHOULD EXPECT: Some feelings of bloating in the abdomen. Passage of more gas than usual.  Walking can help get rid of the air that was put into your GI tract during the procedure and reduce the bloating. If you had a lower endoscopy (such as a colonoscopy or flexible sigmoidoscopy) you may notice spotting of blood in your stool or on the toilet paper. If you underwent a bowel prep for your procedure, you may not have a normal bowel movement for a few days.  Please Note:  You might notice some irritation and congestion in your nose or some drainage.  This is from the oxygen used during your procedure.  There is no need for concern and it should clear up in a day or so.  SYMPTOMS TO REPORT IMMEDIATELY:  Following lower endoscopy (colonoscopy or flexible sigmoidoscopy):  Excessive amounts of blood in the stool  Significant tenderness or worsening of abdominal pains  Swelling of the abdomen that is new, acute  Fever of 100F or higher  For urgent or emergent issues, a gastroenterologist can be reached at any hour by calling [phone]. Do not use MyChart messaging for urgent concerns.    DIET:  We do recommend a small meal at first, but then you may proceed to your regular diet.  Drink plenty of fluids but you should avoid alcoholic beverages for 24 hours.  ACTIVITY:  You should plan to take it easy for the rest of today and you  should NOT DRIVE or use heavy machinery until tomorrow (because of the sedation medicines used during the test).    FOLLOW UP: Our staff will call the number listed on your records the next business day following your procedure.  We will call around 7:15- 8:00 am to check on you and address any questions or concerns that you may have regarding the information given to you following your procedure. If we do not reach you, we will leave a message.     If any biopsies were taken you will be contacted by phone or by letter within the next 1-3 weeks.  Please call us at [phone] if you have not heard about the biopsies in 3 weeks.    SIGNATURES/CONFIDENTIALITY: You and/or your care partner have signed paperwork which will be entered into your electronic medical record.  These signatures attest to the fact that that the information above on your After Visit Summary has been reviewed and is understood.  Full responsibility of the confidentiality of this discharge information lies with you and/or your care-partner.

## 2022-12-06 ENCOUNTER — Telehealth: Payer: Self-pay

## 2022-12-06 NOTE — Telephone Encounter (Signed)
  Follow up Call-     12/05/2022   10:36 AM  Call back number  Post procedure Call Back phone  # [phone]  Permission to leave phone message Yes     Patient questions:  Do you have a fever, pain , or abdominal swelling? No. Pain Score  0 *  Have you tolerated food without any problems? Yes.    Have you been able to return to your normal activities? Yes.    Do you have any questions about your discharge instructions: Diet   No. Medications  No. Follow up visit  No.  Do you have questions or concerns about your Care? No.  Actions: * If pain score is 4 or above: No action needed, pain <4.

## 2022-12-07 ENCOUNTER — Encounter: Payer: Self-pay | Admitting: Gastroenterology

## 2022-12-17 NOTE — Progress Notes (Deleted)
[facility]  [address] [place],  [place]  [address] [phone]  Clinic Day:  07/21/2022  Referring physician: Lourena Simmonds, Donald Pore, *  HISTORY OF PRESENT ILLNESS:  The patient is a 72 y.o. female with stage IA (T1c N0 M0) hormone positive breast cancer, status post a right breast lumpectomy in June 2023.  The patient was initially started on anastrozole for her adjuvant endocrine therapy.  However, at this agent, numerous side effects, she was switched to tamoxifen at her last visit.  Unfortunately, her tamoxifen causes her numerous side effects as well, including headaches, myalgias, and a general sense of not feeling well.  Based upon these findings, the patient is not convinced she can receive any form of endocrine therapy for her adjuvant disease management.  Fortunately, she continues to deny having any particular changes in her breasts which concern her for early disease recurrence.  PHYSICAL EXAM: There were no vitals taken for this visit. Wt Readings from Last 3 Encounters:  11/14/22 225 lb (102.1 kg)  07/21/22 220 lb 4.8 oz (99.9 kg)  03/22/22 220 lb 4.8 oz (99.9 kg)   There is no height or weight on file to calculate BMI. Performance status (ECOG): 1 - Symptomatic but completely ambulatory Physical Exam Constitutional:      Appearance: Normal appearance.  HENT:     Mouth/Throat:     Pharynx: Oropharynx is clear. No oropharyngeal exudate.  Cardiovascular:     Rate and Rhythm: Normal rate and regular rhythm.     Heart sounds: No murmur heard.    No friction rub. No gallop.  Pulmonary:     Breath sounds: Normal breath sounds.  Chest:  Breasts:    Right: No swelling, bleeding, inverted nipple, mass, nipple discharge or skin change.     Left: No swelling, bleeding, inverted nipple, mass, nipple discharge or skin change.  Abdominal:     General: Bowel sounds are normal. There is no distension.     Palpations: Abdomen is soft. There  is no mass.     Tenderness: There is no abdominal tenderness.  Musculoskeletal:        General: No tenderness.     Cervical back: Normal range of motion and neck supple.     Right lower leg: No edema.     Left lower leg: No edema.  Lymphadenopathy:     Cervical: No cervical adenopathy.     Right cervical: No superficial, deep or posterior cervical adenopathy.    Left cervical: No superficial, deep or posterior cervical adenopathy.     Upper Body:     Right upper body: No supraclavicular or axillary adenopathy.     Left upper body: No supraclavicular or axillary adenopathy.     Lower Body: No right inguinal adenopathy. No left inguinal adenopathy.  Skin:    Coloration: Skin is not jaundiced.     Findings: No lesion or rash.  Neurological:     General: No focal deficit present.     Mental Status: She is alert and oriented to person, place, and time. Mental status is at baseline.  Psychiatric:        Mood and Affect: Mood normal.        Behavior: Behavior normal.        Thought Content: Thought content normal.        Judgment: Judgment normal.    ASSESSMENT & PLAN:  A 72 y.o. female with stage IA (T1c N0 M0) hormone positive breast  cancer, status post a right breast lumpectomy in June 2023.  Due to her having numerous problems with 2 endocrine agents for her breast cancer, the mutual decision was made to discontinue any further endocrine therapy.  However, I do want this patient to notify our office next week to let us know if the side effects she has had from her tamoxifen have dissipated.  Otherwise, I will see this patient back in 4 months for her next clinical breast exam.  The patient understands all the plans discussed today and is in agreement with them.  Tracie Laye Kirby Funk, MD

## 2022-12-18 ENCOUNTER — Inpatient Hospital Stay: Payer: 59 | Admitting: Oncology

## 2022-12-27 ENCOUNTER — Other Ambulatory Visit: Payer: Self-pay | Admitting: Oncology

## 2022-12-28 ENCOUNTER — Telehealth: Payer: Self-pay | Admitting: Oncology

## 2022-12-28 NOTE — Telephone Encounter (Signed)
Patient has been scheduled. Aware of appt date and time   Scheduling Message Entered by Belva Crome A on 12/28/2022 at  9:25 AM Priority: Routine <No visit type provided>  Department: [facility]  Provider:  Scheduling Notes:  Please schedule f/u with Dr. Melvyn Neth, next available, she was due in March, thanks

## 2023-01-15 NOTE — Progress Notes (Deleted)
 [facility]  [address] [place],    [address] [phone]  Clinic Day:  07/21/2022  Referring physician: Merino Herrera, Jorge, *  HISTORY OF PRESENT ILLNESS:  The patient is a 72 y.o. female with stage IA (T1c N0 M0) hormone positive breast cancer, status post a right breast lumpectomy in June 2023.  The patient was initially started on anastrozole for her adjuvant endocrine therapy.  However, at this agent, numerous side effects, she was switched to tamoxifen at her last visit.  Unfortunately, her tamoxifen causes her numerous side effects as well, including headaches, myalgias, and a general sense of not feeling well.  Based upon these findings, the patient is not convinced she can receive any form of endocrine therapy for her adjuvant disease management.  Fortunately, she continues to deny having any particular changes in her breasts which concern her for early disease recurrence.  PHYSICAL EXAM: There were no vitals taken for this visit. Wt Readings from Last 3 Encounters:  11/14/22 225 lb (102.1 kg)  07/21/22 220 lb 4.8 oz (99.9 kg)  03/22/22 220 lb 4.8 oz (99.9 kg)   There is no height or weight on file to calculate BMI. Performance status (ECOG): 1 - Symptomatic but completely ambulatory Physical Exam Constitutional:      Appearance: Normal appearance.  HENT:     Mouth/Throat:     Pharynx: Oropharynx is clear. No oropharyngeal exudate.  Cardiovascular:     Rate and Rhythm: Normal rate and regular rhythm.     Heart sounds: No murmur heard.    No friction rub. No gallop.  Pulmonary:     Breath sounds: Normal breath sounds.  Chest:  Breasts:    Right: No swelling, bleeding, inverted nipple, mass, nipple discharge or skin change.     Left: No swelling, bleeding, inverted nipple, mass, nipple discharge or skin change.  Abdominal:     General: Bowel sounds are normal. There is no distension.     Palpations: Abdomen is soft. There  is no mass.     Tenderness: There is no abdominal tenderness.  Musculoskeletal:        General: No tenderness.     Cervical back: Normal range of motion and neck supple.     Right lower leg: No edema.     Left lower leg: No edema.  Lymphadenopathy:     Cervical: No cervical adenopathy.     Right cervical: No superficial, deep or posterior cervical adenopathy.    Left cervical: No superficial, deep or posterior cervical adenopathy.     Upper Body:     Right upper body: No supraclavicular or axillary adenopathy.     Left upper body: No supraclavicular or axillary adenopathy.     Lower Body: No right inguinal adenopathy. No left inguinal adenopathy.  Skin:    Coloration: Skin is not jaundiced.     Findings: No lesion or rash.  Neurological:     General: No focal deficit present.     Mental Status: She is alert and oriented to person, place, and time. Mental status is at baseline.  Psychiatric:        Mood and Affect: Mood normal.        Behavior: Behavior normal.        Thought Content: Thought content normal.        Judgment: Judgment normal.    ASSESSMENT & PLAN:  A 72 y.o. female with stage IA (T1c N0 M0) hormone positive breast   cancer, status post a right breast lumpectomy in June 2023.  Due to her having numerous problems with 2 endocrine agents for her breast cancer, the mutual decision was made to discontinue any further endocrine therapy.  However, I do want this patient to notify our office next week to let us know if the side effects she has had from her tamoxifen have dissipated.  Otherwise, I will see this patient back in 4 months for her next clinical breast exam.  The patient understands all the plans discussed today and is in agreement with them.  Caulder Wehner A Keirstan Iannello, MD       

## 2023-01-16 ENCOUNTER — Inpatient Hospital Stay: Payer: 59 | Admitting: Oncology

## 2023-09-22 ENCOUNTER — Ambulatory Visit (HOSPITAL_BASED_OUTPATIENT_CLINIC_OR_DEPARTMENT_OTHER)
Admission: EM | Admit: 2023-09-22 | Discharge: 2023-09-22 | Disposition: A | Discharge status: Home / Self Care | Payer: 59 | Attending: Physician Assistant | Admitting: Physician Assistant

## 2023-09-22 ENCOUNTER — Encounter (HOSPITAL_BASED_OUTPATIENT_CLINIC_OR_DEPARTMENT_OTHER): Payer: Self-pay

## 2023-09-22 ENCOUNTER — Ambulatory Visit (HOSPITAL_BASED_OUTPATIENT_CLINIC_OR_DEPARTMENT_OTHER): Payer: 59

## 2023-09-22 DIAGNOSIS — L309 Dermatitis, unspecified: Secondary | ICD-10-CM | POA: Diagnosis not present

## 2023-09-22 DIAGNOSIS — L03213 Periorbital cellulitis: Secondary | ICD-10-CM | POA: Diagnosis not present

## 2023-09-22 MED ORDER — HYDROCORTISONE 0.5 % EX CREA: 1.0000 | TOPICAL_CREAM | Freq: Two times a day (BID) | CUTANEOUS | 0 refills | Status: AC

## 2023-09-22 MED ORDER — AMOXICILLIN-POT CLAVULANATE 500-125 MG PO TABS: 1.0000 | ORAL_TABLET | Freq: Two times a day (BID) | ORAL | 0 refills | Status: AC

## 2023-09-22 NOTE — Discharge Instructions (Addendum)
 We are treating you for an infection.  Start Augmentin twice daily for 7 days.  It is also possible that the rash/irritation is related to the eyedrops.  Please stop using these.  Keep this area clean and use a small amount of hydrocortisone if the skin is itchy or irritated.  Follow-up with your primary care.  If anything worsens and you have additional redness, fever, nausea, vomiting, pain when you move your eyes, vision change you need to be seen immediately.

## 2023-09-22 NOTE — ED Provider Notes (Signed)
 [facility]    CSN: [id number] Arrival date & time: 09/22/23  4098      History   Chief Complaint Chief Complaint  Patient presents with   Facial Swelling    HPI Tracie Frank is a 73 y.o. female.   Patient presents today with a 3-day rash around her left eye.  She reports that this is itchy, irritated, stinging.  Initially she had blisters and was concerned that it was shingles prompting evaluation, however, the blisters have since resolved though she continues to have redness and irritation.  She does report initially her vision was blurred but she is unsure if this is because of the associated swelling or an actual visual disturbance.  This is since resolved and she denies any vision change, headache, dizziness, nausea, vomiting, photophobia, ocular pain.  She has not had shingles vaccination.  She has been taking Tylenol over-the-counter but no additional symptoms.  Denies any fever.  She is eating and drinking normally.  Denies any recent antibiotics.  She does report that about 1 to 2 weeks ago she was treated for conjunctivitis with an antibiotic drop.  She has not run by the name of this medication as she has had it in the past.  She was using in both eyes and is not experiencing any symptoms on the right but is unsure if this could have been an allergic reaction related to use of this medication.  She is feeling much better today but still wanted to be evaluated to ensure there is not something else going on.    Past Medical History:  Diagnosis Date   Allergy    Anxiety    Arthritis    Breast cancer (HCC)    Cushing's disease (HCC)    Depression    Diabetes mellitus 10 years ago   pt states is currently not taking any medications for DM   Headache    History of frequent urinary tract infections    Hyperlipidemia    Hypertension    pt states was on BP meds in past but is currently not having to take medications    Motion sickness    Peripheral vascular  disease (HCC)    right leg sig. and left leg moderate   Pneumonia    PONV (postoperative nausea and vomiting)    Sleep apnea    Wears glasses     Patient Active Problem List   Diagnosis Date Noted   Breast cancer (HCC) 02/22/2022   Heel callus 01/13/2022   Diabetes mellitus without complication (HCC) 01/13/2022   S/P insertion of spinal cord stimulator 01/23/2018   Morbid obesity (HCC) 01/12/2016   S/P right THA, AA 01/11/2016    Past Surgical History:  Procedure Laterality Date   ADRENALECTOMY  ~8-10   Left   AXILLARY SENTINEL NODE BIOPSY Right    bladder polyp     BREAST BIOPSY Right    BREAST LUMPECTOMY Right    COLONOSCOPY  04/01/2014   Colonic polyps status post polypectomy. Pancolonic diverticulosis predominantly sigmoid diverticulosis   DILATION AND CURETTAGE OF UTERUS     X 3   ENDOMETRIAL ABLATION     HERNIA REPAIR     UPPER LEFT SIDE   JOINT REPLACEMENT     left knee 2013   SPINAL CORD STIMULATOR INSERTION N/A 01/23/2018   Procedure: LUMBAR SPINAL CORD STIMULATOR INSERTION;  Surgeon: Venita Lick, MD;  Location: [facility];  Service: Orthopedics;  Laterality: N/A;  3 hrs  TONSILLECTOMY     TONSILLECTOMY AND ADENOIDECTOMY     TOTAL HIP ARTHROPLASTY Right 01/11/2016   Procedure: RIGHT TOTAL HIP ARTHROPLASTY ANTERIOR APPROACH;  Surgeon: Durene Romans, MD;  Location: [facility];  Service: Orthopedics;  Laterality: Right;    OB History   No obstetric history on file.      Home Medications    Prior to Admission medications   Medication Sig Start Date End Date Taking? Authorizing Provider  amoxicillin-clavulanate (AUGMENTIN) 500-125 MG tablet Take 1 tablet by mouth in the morning and at bedtime for 7 days. 09/22/23 09/29/23 Yes Darius Lundberg, Noberto Retort, PA-C  hydrocortisone cream 0.5 % Apply 1 Application topically 2 (two) times daily. 09/22/23  Yes Dedrick Heffner, Noberto Retort, PA-C  ALPRAZolam (XANAX) 0.25 MG tablet Take 0.25 mg by mouth at bedtime as needed for anxiety or sleep.  Patient  not taking: Reported on 11/14/2022    [provider]  aspirin EC 81 MG tablet Take 1 tablet every day by oral route. 01/10/13   [provider]  atorvastatin (LIPITOR) 40 MG tablet Take 40 mg by mouth at bedtime.    [provider]  buPROPion (WELLBUTRIN XL) 300 MG 24 hr tablet Take 300 mg by mouth daily.    [provider]  glipiZIDE (GLUCOTROL) 5 MG tablet TAKE 1/2 TABLET BY MOUTH BEFORE BREAKFAST FOR DIABETES 05/15/11   [provider]  HYDROcodone-acetaminophen (NORCO/VICODIN) 5-325 MG tablet Take by mouth.    [provider]  lisinopril (PRINIVIL,ZESTRIL) 10 MG tablet Take 10 mg by mouth daily.    [provider]  mirtazapine (REMERON) 15 MG tablet Take 15 mg by mouth at bedtime.    [provider]  oxybutynin (DITROPAN-XL) 10 MG 24 hr tablet Take 10 mg by mouth at bedtime.    [provider]  pravastatin (PRAVACHOL) 40 MG tablet Take by mouth. 05/15/11   [provider]  tamoxifen (NOLVADEX) 20 MG tablet Take 1 tablet (20 mg total) by mouth daily. 03/22/22   Weston Settle, MD    Family History Family History  Problem Relation Age of Onset   Dementia Mother    Hypertension Sister    Heart disease Brother    Hypertension Brother    Hyperlipidemia Brother    Obesity Brother    Diabetes Brother    Cancer Maternal Aunt        leukemia   Heart disease Maternal Grandmother    Stroke Maternal Grandmother    Colon cancer Neg Hx    Colon polyps Neg Hx    Esophageal cancer Neg Hx    Rectal cancer Neg Hx    Stomach cancer Neg Hx     Social History Social History   Tobacco Use   Smoking status: Former    Current packs/day: 0.00    Average packs/day: 1 pack/day for 15.0 years (15.0 ttl pk-yrs)    Types: Cigarettes    Start date: 03/16/1985    Quit date: 03/16/2000    Years since quitting: 23.5   Smokeless tobacco: Never  Vaping Use   Vaping status: Never Used  Substance Use Topics   Alcohol  use: No   Drug use: No     Allergies   Gabapentin, Lyrica [pregabalin], Morphine and codeine, and Other   Review of Systems Review of Systems  Constitutional:  Positive for activity change. Negative for appetite change, fatigue and fever.  Eyes:  Negative for photophobia, pain, discharge, redness, itching and visual disturbance.  Gastrointestinal:  Negative for abdominal pain, diarrhea, nausea and vomiting.  Musculoskeletal:  Negative for arthralgias and myalgias.  Skin:  Positive for color change and rash. Negative for wound.  Neurological:  Negative for dizziness, light-headedness and headaches.     Physical Exam Triage Vital Signs ED Triage Vitals  Encounter Vitals Group     BP 09/22/23 0934 112/78     Systolic BP Percentile --      Diastolic BP Percentile --      Pulse Rate 09/22/23 0934 (!) 102     Resp 09/22/23 0934 20     Temp 09/22/23 0934 97.6 F (36.4 C)     Temp Source 09/22/23 0934 Oral     SpO2 09/22/23 0934 100 %     Weight --      Height --      Head Circumference --      Peak Flow --      Pain Score 09/22/23 0935 5     Pain Loc --      Pain Education --      Exclude from Growth Chart --    No data found.  Updated Vital Signs BP 112/78 (BP Location: Right Arm)   Pulse 83   Temp 97.6 F (36.4 C) (Oral)   Resp 20   SpO2 100%   Visual Acuity Right Eye Distance: 20/30 Left Eye Distance: 20/30 Bilateral Distance: 20/25 (Corrected)  Right Eye Near:   Left Eye Near:    Bilateral Near:     Physical Exam Vitals reviewed.  Constitutional:      General: She is awake. She is not in acute distress.    Appearance: Normal appearance. She is well-developed. She is not ill-appearing.     Comments: Very pleasant female appears stated age in no acute distress sitting comfortably in exam room  HENT:     Head: Normocephalic and atraumatic.  Eyes:     General: Lids are normal.     Extraocular Movements: Extraocular movements intact.      Conjunctiva/sclera:     Right eye: Right conjunctiva is not injected. No chemosis.    Left eye: Left conjunctiva is not injected. No chemosis.    Pupils: Pupils are equal, round, and reactive to light.     Left eye: No corneal abrasion or fluorescein uptake. Seidel exam negative.    Comments: Mild erythema surrounding left eye without vesicles or pustules.  Fluorescein staining was performed on left eye without defect or dendritic pattern.  Cardiovascular:     Rate and Rhythm: Normal rate and regular rhythm.     Heart sounds: Normal heart sounds, S1 normal and S2 normal. No murmur heard. Pulmonary:     Effort: Pulmonary effort is normal.     Breath sounds: Normal breath sounds. No wheezing, rhonchi or rales.     Comments: Clear to auscultation bilaterally Psychiatric:        Behavior: Behavior is cooperative.      UC Treatments / Results  Labs (all labs ordered are listed, but only abnormal results are displayed) Labs Reviewed - No data to display  EKG   Radiology No results found.  Procedures Procedures (including critical care time)  Medications Ordered in UC Medications - No data to display  Initial Impression / Assessment and Plan / UC Course  I have reviewed the triage vital signs and the nursing notes.  Pertinent labs & imaging results that were available during my care of the patient were reviewed by me and  considered in my medical decision making (see chart for details).     Patient is well-appearing, afebrile, nontoxic.  She was mildly tachycardic but this improved on recheck.  Low suspicion for shingles given she does not have any abnormalities on fluorescein staining and no vesicles noted on exam.  Discussed that I am concerned for allergic reaction to the ophthalmologic antibiotic she was previously prescribed versus preseptal cellulitis.  She was encouraged to keep the area clean and apply hydrocortisone with any itching or irritation of the skin.  We will  discontinue the ophthalmologic antibiotic as conjunctivitis seems to have resolved and this could be contributing to her symptoms.  Will also start Augmentin with no indication for dress adjustment based on her metabolic panel from 2023 with a creatinine of 0.6 and calculated creatinine clearance of 134.54 mL/min.  We discussed that she should follow-up with her primary care first thing next week to ensure symptom improvement.  If she has any worsening symptoms including swelling of her eye, increasing discomfort, pain when she moves her eye, fever, nausea, vomiting she needs to be seen immediately.  Strict return precautions given.  All questions answered to patient satisfaction.  Final Clinical Impressions(s) / UC Diagnoses   Final diagnoses:  Periorbital dermatitis  Preseptal cellulitis of left eye     Discharge Instructions      We are treating you for an infection.  Start Augmentin twice daily for 7 days.  It is also possible that the rash/irritation is related to the eyedrops.  Please stop using these.  Keep this area clean and use a small amount of hydrocortisone if the skin is itchy or irritated.  Follow-up with your primary care.  If anything worsens and you have additional redness, fever, nausea, vomiting, pain when you move your eyes, vision change you need to be seen immediately.     ED Prescriptions     Medication Sig Dispense Auth. Provider   hydrocortisone cream 0.5 % Apply 1 Application topically 2 (two) times daily. 30 g Miklo Aken K, PA-C   amoxicillin-clavulanate (AUGMENTIN) 500-125 MG tablet Take 1 tablet by mouth in the morning and at bedtime for 7 days. 14 tablet Jantzen Pilger, Noberto Retort, PA-C      PDMP not reviewed this encounter.   Jeani Hawking, PA-C 09/22/23 1018

## 2023-09-22 NOTE — ED Triage Notes (Signed)
 Patient reports that 3 days ago, had swelling and "pimple looking" sores around left eye. States sores are gone and swelling has improved but still painful/sore. Left cheek red.

## 2023-11-13 DIAGNOSIS — E785 Hyperlipidemia, unspecified: Secondary | ICD-10-CM | POA: Diagnosis not present

## 2023-11-13 DIAGNOSIS — S0083XA Contusion of other part of head, initial encounter: Secondary | ICD-10-CM | POA: Diagnosis not present

## 2023-11-13 DIAGNOSIS — E119 Type 2 diabetes mellitus without complications: Secondary | ICD-10-CM | POA: Diagnosis not present

## 2023-11-13 DIAGNOSIS — M1990 Unspecified osteoarthritis, unspecified site: Secondary | ICD-10-CM | POA: Diagnosis not present

## 2023-11-13 DIAGNOSIS — Z9682 Presence of neurostimulator: Secondary | ICD-10-CM | POA: Diagnosis not present

## 2023-11-13 DIAGNOSIS — K8020 Calculus of gallbladder without cholecystitis without obstruction: Secondary | ICD-10-CM | POA: Diagnosis not present

## 2023-11-13 DIAGNOSIS — Z87891 Personal history of nicotine dependence: Secondary | ICD-10-CM | POA: Diagnosis not present

## 2023-11-13 DIAGNOSIS — E876 Hypokalemia: Secondary | ICD-10-CM | POA: Diagnosis not present

## 2023-11-13 DIAGNOSIS — R112 Nausea with vomiting, unspecified: Secondary | ICD-10-CM | POA: Diagnosis not present

## 2023-11-13 DIAGNOSIS — W19XXXA Unspecified fall, initial encounter: Secondary | ICD-10-CM | POA: Diagnosis not present

## 2023-11-13 DIAGNOSIS — Z91030 Bee allergy status: Secondary | ICD-10-CM | POA: Diagnosis not present

## 2023-11-13 DIAGNOSIS — E860 Dehydration: Secondary | ICD-10-CM | POA: Diagnosis not present

## 2023-11-13 DIAGNOSIS — Z8616 Personal history of COVID-19: Secondary | ICD-10-CM | POA: Diagnosis not present

## 2023-11-13 DIAGNOSIS — S0003XA Contusion of scalp, initial encounter: Secondary | ICD-10-CM | POA: Diagnosis not present

## 2023-11-13 DIAGNOSIS — Z884 Allergy status to anesthetic agent status: Secondary | ICD-10-CM | POA: Diagnosis not present

## 2023-11-13 DIAGNOSIS — H538 Other visual disturbances: Secondary | ICD-10-CM | POA: Diagnosis not present

## 2023-11-13 DIAGNOSIS — R9431 Abnormal electrocardiogram [ECG] [EKG]: Secondary | ICD-10-CM | POA: Diagnosis not present

## 2023-11-13 DIAGNOSIS — K7689 Other specified diseases of liver: Secondary | ICD-10-CM | POA: Diagnosis not present

## 2023-11-13 DIAGNOSIS — Z853 Personal history of malignant neoplasm of breast: Secondary | ICD-10-CM | POA: Diagnosis not present

## 2023-11-13 DIAGNOSIS — S199XXA Unspecified injury of neck, initial encounter: Secondary | ICD-10-CM | POA: Diagnosis not present

## 2023-11-13 DIAGNOSIS — R000 Tachycardia, unspecified: Secondary | ICD-10-CM | POA: Diagnosis not present

## 2023-11-13 DIAGNOSIS — I10 Essential (primary) hypertension: Secondary | ICD-10-CM | POA: Diagnosis not present

## 2023-11-13 DIAGNOSIS — E039 Hypothyroidism, unspecified: Secondary | ICD-10-CM | POA: Diagnosis not present

## 2023-11-13 DIAGNOSIS — Z885 Allergy status to narcotic agent status: Secondary | ICD-10-CM | POA: Diagnosis not present

## 2023-11-13 DIAGNOSIS — D649 Anemia, unspecified: Secondary | ICD-10-CM | POA: Diagnosis not present

## 2023-11-13 DIAGNOSIS — Z7982 Long term (current) use of aspirin: Secondary | ICD-10-CM | POA: Diagnosis not present

## 2023-11-13 DIAGNOSIS — R55 Syncope and collapse: Secondary | ICD-10-CM | POA: Diagnosis not present

## 2023-11-13 DIAGNOSIS — E440 Moderate protein-calorie malnutrition: Secondary | ICD-10-CM | POA: Diagnosis not present

## 2023-11-13 DIAGNOSIS — R531 Weakness: Secondary | ICD-10-CM | POA: Diagnosis not present

## 2023-11-14 DIAGNOSIS — R55 Syncope and collapse: Secondary | ICD-10-CM | POA: Diagnosis not present

## 2023-11-21 DIAGNOSIS — L309 Dermatitis, unspecified: Secondary | ICD-10-CM | POA: Diagnosis not present

## 2023-11-23 DIAGNOSIS — M79621 Pain in right upper arm: Secondary | ICD-10-CM | POA: Diagnosis not present

## 2023-11-23 DIAGNOSIS — Z7982 Long term (current) use of aspirin: Secondary | ICD-10-CM | POA: Diagnosis not present

## 2023-11-23 DIAGNOSIS — Z794 Long term (current) use of insulin: Secondary | ICD-10-CM | POA: Diagnosis not present

## 2023-11-23 DIAGNOSIS — Z96641 Presence of right artificial hip joint: Secondary | ICD-10-CM | POA: Diagnosis not present

## 2023-11-23 DIAGNOSIS — S4991XA Unspecified injury of right shoulder and upper arm, initial encounter: Secondary | ICD-10-CM | POA: Diagnosis not present

## 2023-11-23 DIAGNOSIS — K5730 Diverticulosis of large intestine without perforation or abscess without bleeding: Secondary | ICD-10-CM | POA: Diagnosis not present

## 2023-11-23 DIAGNOSIS — Z79899 Other long term (current) drug therapy: Secondary | ICD-10-CM | POA: Diagnosis not present

## 2023-11-23 DIAGNOSIS — R531 Weakness: Secondary | ICD-10-CM | POA: Diagnosis not present

## 2023-11-23 DIAGNOSIS — R29898 Other symptoms and signs involving the musculoskeletal system: Secondary | ICD-10-CM | POA: Diagnosis not present

## 2023-11-23 DIAGNOSIS — M1611 Unilateral primary osteoarthritis, right hip: Secondary | ICD-10-CM | POA: Diagnosis not present

## 2023-11-23 DIAGNOSIS — M6281 Muscle weakness (generalized): Secondary | ICD-10-CM | POA: Diagnosis not present

## 2023-11-23 DIAGNOSIS — W19XXXA Unspecified fall, initial encounter: Secondary | ICD-10-CM | POA: Diagnosis not present

## 2024-04-01 ENCOUNTER — Telehealth: Payer: Self-pay

## 2024-04-01 NOTE — Telephone Encounter (Signed)
 Called and spoke with the patient regarding the following:  Our office has you scheduled for a new patient appointment on 04/10/24, with an arrival time of 9:30 AM. Please remember to bring your insurance card, photo ID, and a medication list of anything you're currently taking at this time. Should you need to cancel the appointment, we do require a 24-hour notice. If the appointment is canceled the day of or no showed, then we will not be able to reschedule your new patient appointment.   Patient was understanding and confirmed the appointment date and time.

## 2024-04-10 ENCOUNTER — Ambulatory Visit

## 2024-04-30 IMAGING — MG MM DIGITAL SCREENING BILAT W/ TOMO AND CAD
6 of 10 series · 6 of 30 positions shown · non-contrast
Comparison: Previous exam(s).

CLINICAL DATA: Screening.

EXAM:
DIGITAL SCREENING BILATERAL MAMMOGRAM WITH TOMOSYNTHESIS AND CAD
TECHNIQUE: Bilateral screening digital craniocaudal and mediolateral oblique
mammograms were obtained. Bilateral screening digital breast
tomosynthesis was performed. The images were evaluated with
computer-aided detection.

[L MLO synth-2D]
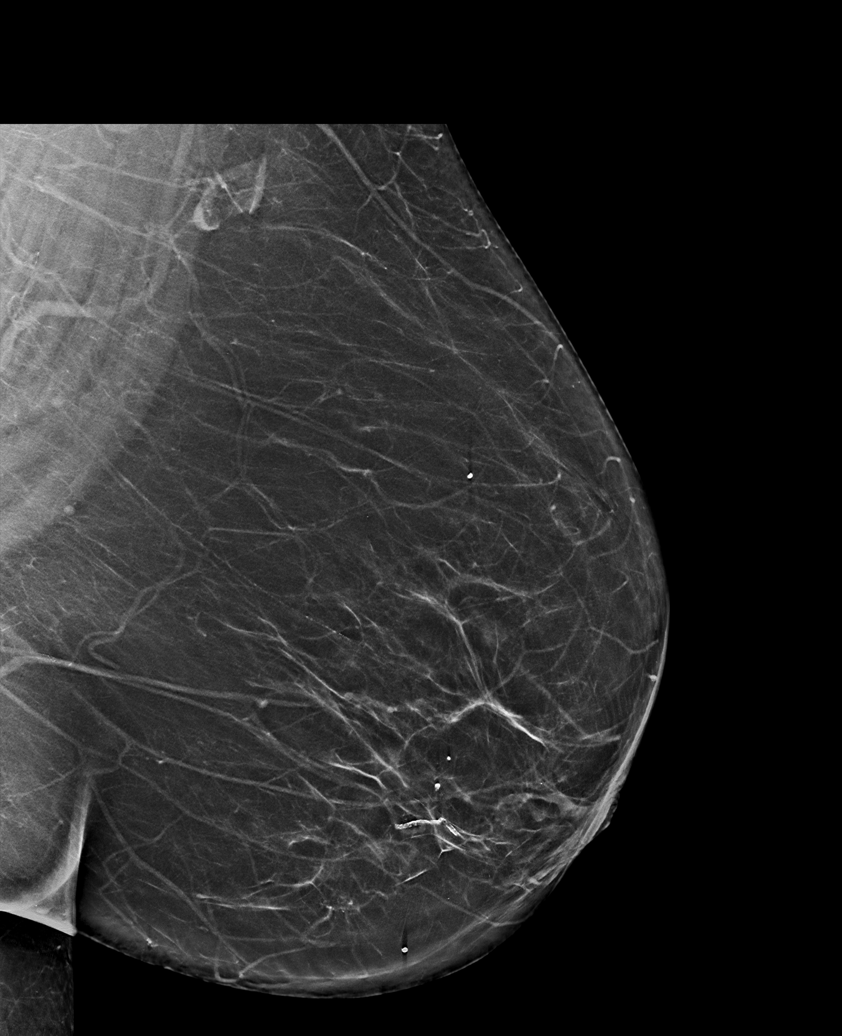

[L CC synth-2D]
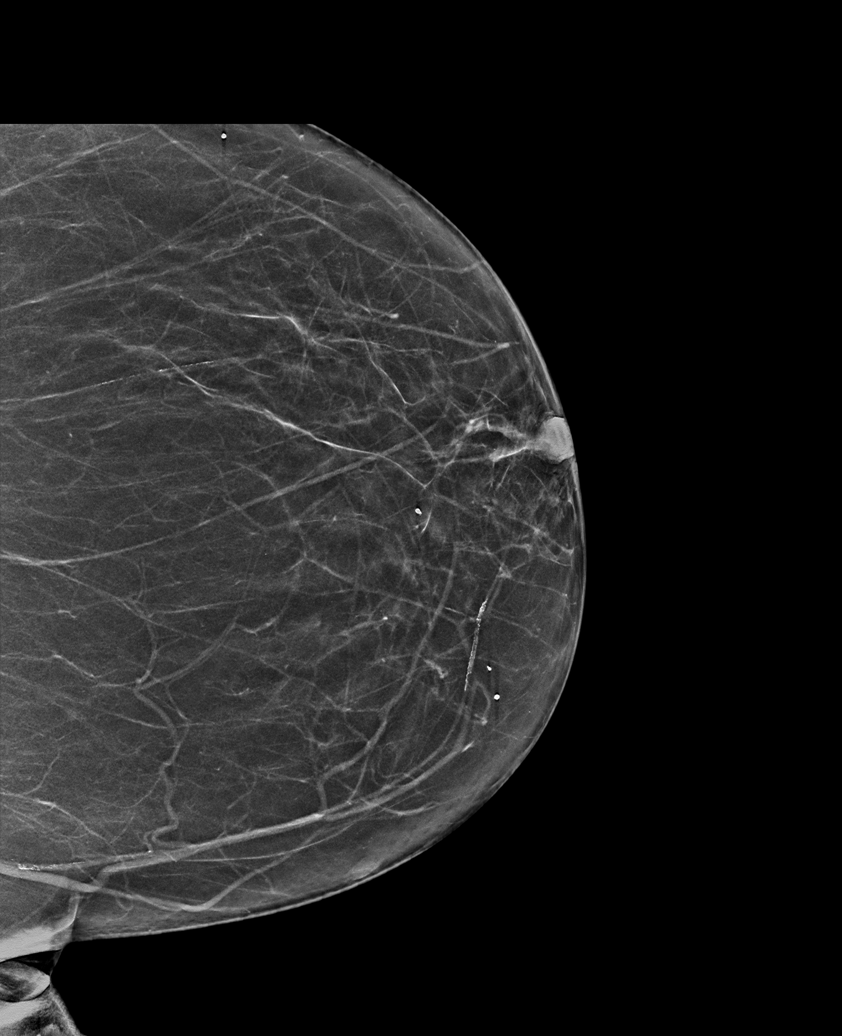

[R MLO synth-2D]
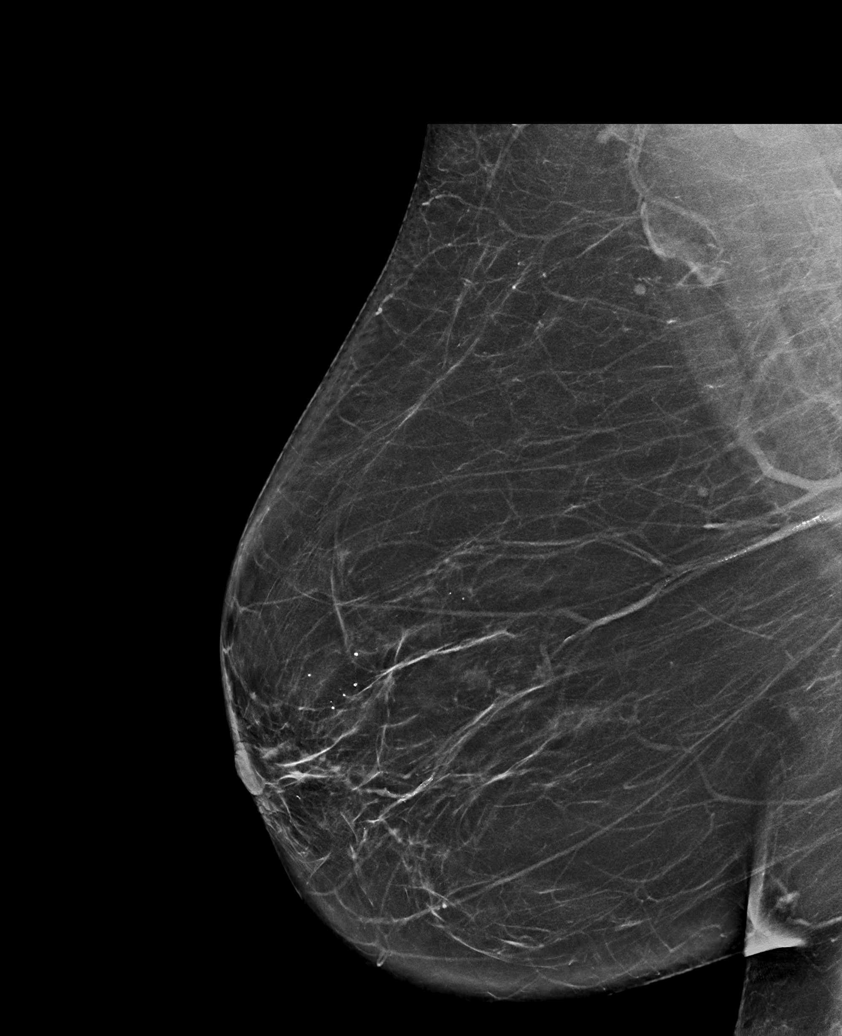

[R CC synth-2D (1 of 2)]
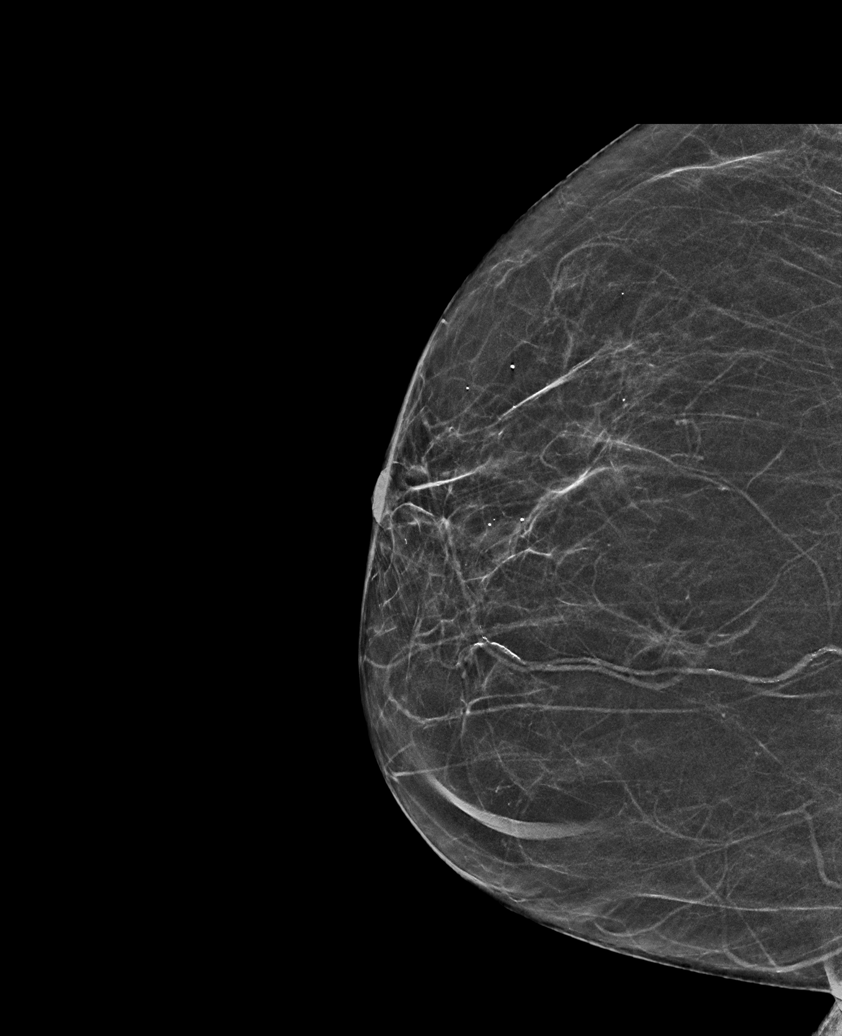

[R CC synth-2D (2 of 2)]
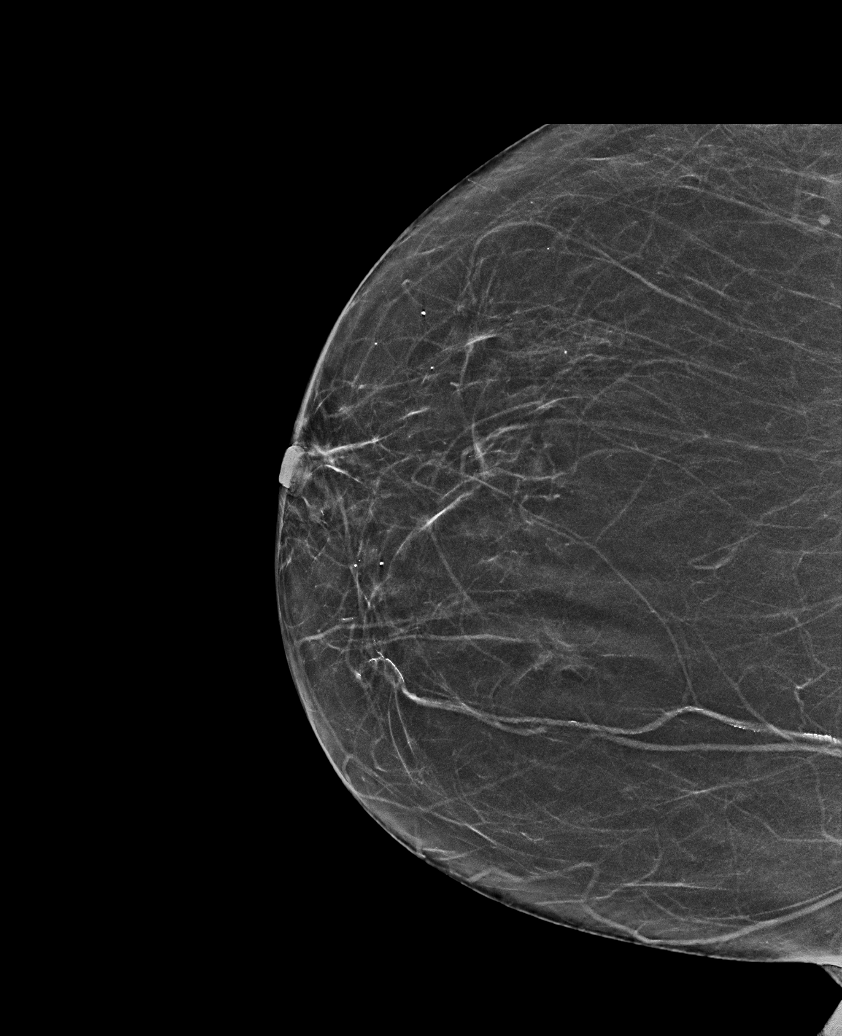

[R MLO tomo · tomo slice 36/71.0]
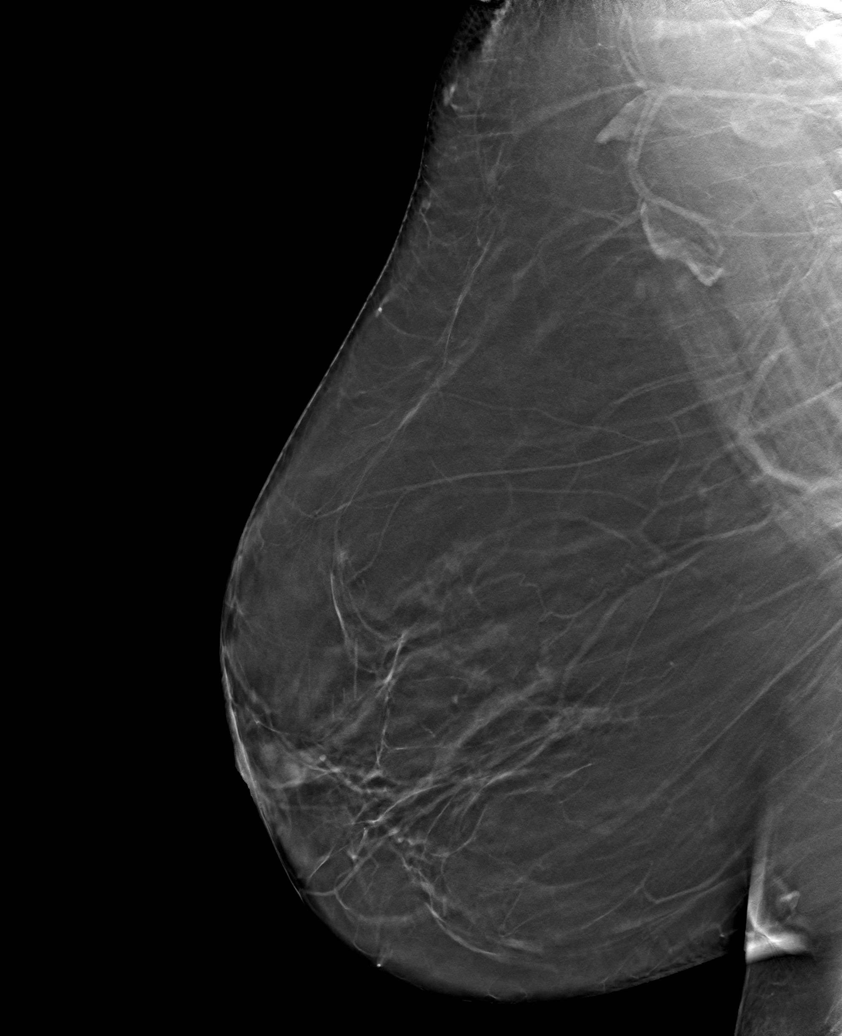

[6 of 30 positions shown; findings below may reference images not displayed]

ACR Breast Density Category b: There are scattered areas of
fibroglandular density.
FINDINGS: In the right breast, possible distortion warrants further
evaluation. This possible distortion is seen within the inner RIGHT
breast, cc slice 26 and MLO slice 32.

In the left breast, no findings suspicious for malignancy.
IMPRESSION: Further evaluation is suggested for possible distortion in the right
breast.

RECOMMENDATION:
Diagnostic mammogram and possibly ultrasound of the right breast.
(Code:[id number])

The patient will be contacted regarding the findings, and additional
imaging will be scheduled.

BI-RADS CATEGORY  0: Incomplete. Need additional imaging evaluation
and/or prior mammograms for comparison.

## 2024-08-30 DIAGNOSIS — I4891 Unspecified atrial fibrillation: Secondary | ICD-10-CM | POA: Diagnosis not present

## 2024-08-30 DIAGNOSIS — I959 Hypotension, unspecified: Secondary | ICD-10-CM | POA: Diagnosis not present

## 2024-08-30 DIAGNOSIS — R9431 Abnormal electrocardiogram [ECG] [EKG]: Secondary | ICD-10-CM | POA: Diagnosis not present

## 2024-08-30 DIAGNOSIS — I491 Atrial premature depolarization: Secondary | ICD-10-CM | POA: Diagnosis not present

## 2024-08-30 DIAGNOSIS — K5732 Diverticulitis of large intestine without perforation or abscess without bleeding: Secondary | ICD-10-CM | POA: Diagnosis not present

## 2024-08-30 DIAGNOSIS — I361 Nonrheumatic tricuspid (valve) insufficiency: Secondary | ICD-10-CM | POA: Diagnosis not present

## 2024-08-30 DIAGNOSIS — I480 Paroxysmal atrial fibrillation: Secondary | ICD-10-CM | POA: Diagnosis not present

## 2024-08-30 DIAGNOSIS — E876 Hypokalemia: Secondary | ICD-10-CM | POA: Diagnosis not present

## 2024-08-31 DIAGNOSIS — I959 Hypotension, unspecified: Secondary | ICD-10-CM | POA: Diagnosis not present

## 2024-08-31 DIAGNOSIS — E876 Hypokalemia: Secondary | ICD-10-CM | POA: Diagnosis not present

## 2024-08-31 DIAGNOSIS — I480 Paroxysmal atrial fibrillation: Secondary | ICD-10-CM | POA: Diagnosis not present

## 2024-08-31 DIAGNOSIS — K5732 Diverticulitis of large intestine without perforation or abscess without bleeding: Secondary | ICD-10-CM | POA: Diagnosis not present

## 2024-08-31 DIAGNOSIS — R9431 Abnormal electrocardiogram [ECG] [EKG]: Secondary | ICD-10-CM | POA: Diagnosis not present

## 2024-08-31 DIAGNOSIS — I491 Atrial premature depolarization: Secondary | ICD-10-CM | POA: Diagnosis not present

## 2024-09-01 DIAGNOSIS — I959 Hypotension, unspecified: Secondary | ICD-10-CM | POA: Diagnosis not present

## 2024-09-01 DIAGNOSIS — I480 Paroxysmal atrial fibrillation: Secondary | ICD-10-CM | POA: Diagnosis not present

## 2024-09-01 DIAGNOSIS — E876 Hypokalemia: Secondary | ICD-10-CM | POA: Diagnosis not present

## 2024-09-01 DIAGNOSIS — K5732 Diverticulitis of large intestine without perforation or abscess without bleeding: Secondary | ICD-10-CM | POA: Diagnosis not present

## 2024-09-01 DIAGNOSIS — R9431 Abnormal electrocardiogram [ECG] [EKG]: Secondary | ICD-10-CM | POA: Diagnosis not present

## 2024-09-02 DIAGNOSIS — R9431 Abnormal electrocardiogram [ECG] [EKG]: Secondary | ICD-10-CM | POA: Diagnosis not present

## 2024-09-02 DIAGNOSIS — I480 Paroxysmal atrial fibrillation: Secondary | ICD-10-CM | POA: Diagnosis not present

## 2024-09-02 DIAGNOSIS — E876 Hypokalemia: Secondary | ICD-10-CM | POA: Diagnosis not present

## 2024-09-02 DIAGNOSIS — K5732 Diverticulitis of large intestine without perforation or abscess without bleeding: Secondary | ICD-10-CM | POA: Diagnosis not present

## 2024-09-03 DIAGNOSIS — I480 Paroxysmal atrial fibrillation: Secondary | ICD-10-CM | POA: Diagnosis not present

## 2024-09-03 DIAGNOSIS — K5732 Diverticulitis of large intestine without perforation or abscess without bleeding: Secondary | ICD-10-CM | POA: Diagnosis not present

## 2024-09-04 DIAGNOSIS — K5732 Diverticulitis of large intestine without perforation or abscess without bleeding: Secondary | ICD-10-CM | POA: Diagnosis not present

## 2024-09-04 DIAGNOSIS — R9431 Abnormal electrocardiogram [ECG] [EKG]: Secondary | ICD-10-CM | POA: Diagnosis not present

## 2024-09-04 DIAGNOSIS — I480 Paroxysmal atrial fibrillation: Secondary | ICD-10-CM | POA: Diagnosis not present
